# Patient Record
Sex: Female | Born: 1955 | Race: White | Hispanic: No | Marital: Married | Smoking: Former smoker
Health system: Southern US, Community
[De-identification: ages and names within clinical notes are randomized; demographics above are authoritative.]

## PROBLEM LIST (undated history)

## (undated) DIAGNOSIS — J449 Chronic obstructive pulmonary disease, unspecified: Secondary | ICD-10-CM

## (undated) DIAGNOSIS — J441 Chronic obstructive pulmonary disease with (acute) exacerbation: Secondary | ICD-10-CM

## (undated) DIAGNOSIS — I10 Essential (primary) hypertension: Secondary | ICD-10-CM

## (undated) DIAGNOSIS — J189 Pneumonia, unspecified organism: Secondary | ICD-10-CM

## (undated) DIAGNOSIS — E1110 Type 2 diabetes mellitus with ketoacidosis without coma: Secondary | ICD-10-CM

## (undated) HISTORY — PX: NO PAST SURGERIES: SHX2092

---

## 2017-03-18 ENCOUNTER — Inpatient Hospital Stay (HOSPITAL_COMMUNITY): Payer: Medicaid Other

## 2017-03-18 ENCOUNTER — Encounter (HOSPITAL_COMMUNITY): Payer: Self-pay | Admitting: Emergency Medicine

## 2017-03-18 ENCOUNTER — Inpatient Hospital Stay (HOSPITAL_COMMUNITY)
Admission: EM | Admit: 2017-03-18 | Discharge: 2017-04-10 | DRG: 871 | Disposition: E | Payer: Medicaid Other | Attending: Internal Medicine | Admitting: Internal Medicine

## 2017-03-18 ENCOUNTER — Emergency Department (HOSPITAL_COMMUNITY): Payer: Medicaid Other

## 2017-03-18 DIAGNOSIS — A419 Sepsis, unspecified organism: Principal | ICD-10-CM | POA: Diagnosis present

## 2017-03-18 DIAGNOSIS — Z888 Allergy status to other drugs, medicaments and biological substances status: Secondary | ICD-10-CM | POA: Diagnosis not present

## 2017-03-18 DIAGNOSIS — F17210 Nicotine dependence, cigarettes, uncomplicated: Secondary | ICD-10-CM | POA: Diagnosis present

## 2017-03-18 DIAGNOSIS — E1110 Type 2 diabetes mellitus with ketoacidosis without coma: Secondary | ICD-10-CM | POA: Diagnosis present

## 2017-03-18 DIAGNOSIS — N179 Acute kidney failure, unspecified: Secondary | ICD-10-CM | POA: Diagnosis present

## 2017-03-18 DIAGNOSIS — J690 Pneumonitis due to inhalation of food and vomit: Secondary | ICD-10-CM | POA: Diagnosis present

## 2017-03-18 DIAGNOSIS — R739 Hyperglycemia, unspecified: Secondary | ICD-10-CM | POA: Diagnosis present

## 2017-03-18 DIAGNOSIS — J9601 Acute respiratory failure with hypoxia: Secondary | ICD-10-CM | POA: Insufficient documentation

## 2017-03-18 DIAGNOSIS — H3530 Unspecified macular degeneration: Secondary | ICD-10-CM | POA: Diagnosis present

## 2017-03-18 DIAGNOSIS — J189 Pneumonia, unspecified organism: Secondary | ICD-10-CM | POA: Diagnosis not present

## 2017-03-18 DIAGNOSIS — G9341 Metabolic encephalopathy: Secondary | ICD-10-CM | POA: Diagnosis present

## 2017-03-18 DIAGNOSIS — J9621 Acute and chronic respiratory failure with hypoxia: Secondary | ICD-10-CM | POA: Diagnosis present

## 2017-03-18 DIAGNOSIS — S0181XA Laceration without foreign body of other part of head, initial encounter: Secondary | ICD-10-CM | POA: Diagnosis present

## 2017-03-18 DIAGNOSIS — Z66 Do not resuscitate: Secondary | ICD-10-CM | POA: Diagnosis present

## 2017-03-18 DIAGNOSIS — J9622 Acute and chronic respiratory failure with hypercapnia: Secondary | ICD-10-CM | POA: Diagnosis present

## 2017-03-18 DIAGNOSIS — Z515 Encounter for palliative care: Secondary | ICD-10-CM | POA: Diagnosis not present

## 2017-03-18 DIAGNOSIS — R634 Abnormal weight loss: Secondary | ICD-10-CM | POA: Diagnosis present

## 2017-03-18 DIAGNOSIS — E0810 Diabetes mellitus due to underlying condition with ketoacidosis without coma: Secondary | ICD-10-CM | POA: Diagnosis not present

## 2017-03-18 DIAGNOSIS — E871 Hypo-osmolality and hyponatremia: Secondary | ICD-10-CM | POA: Diagnosis present

## 2017-03-18 DIAGNOSIS — Z9981 Dependence on supplemental oxygen: Secondary | ICD-10-CM | POA: Diagnosis not present

## 2017-03-18 DIAGNOSIS — R4182 Altered mental status, unspecified: Secondary | ICD-10-CM

## 2017-03-18 DIAGNOSIS — J9602 Acute respiratory failure with hypercapnia: Secondary | ICD-10-CM

## 2017-03-18 DIAGNOSIS — J441 Chronic obstructive pulmonary disease with (acute) exacerbation: Secondary | ICD-10-CM | POA: Diagnosis present

## 2017-03-18 DIAGNOSIS — Z9181 History of falling: Secondary | ICD-10-CM

## 2017-03-18 DIAGNOSIS — W010XXA Fall on same level from slipping, tripping and stumbling without subsequent striking against object, initial encounter: Secondary | ICD-10-CM | POA: Diagnosis present

## 2017-03-18 DIAGNOSIS — Z7189 Other specified counseling: Secondary | ICD-10-CM | POA: Diagnosis not present

## 2017-03-18 DIAGNOSIS — E872 Acidosis: Secondary | ICD-10-CM | POA: Diagnosis present

## 2017-03-18 DIAGNOSIS — D72829 Elevated white blood cell count, unspecified: Secondary | ICD-10-CM | POA: Diagnosis present

## 2017-03-18 HISTORY — DX: Type 2 diabetes mellitus with ketoacidosis without coma: E11.10

## 2017-03-18 HISTORY — DX: Essential (primary) hypertension: I10

## 2017-03-18 HISTORY — DX: Chronic obstructive pulmonary disease, unspecified: J44.9

## 2017-03-18 HISTORY — DX: Pneumonia, unspecified organism: J18.9

## 2017-03-18 HISTORY — DX: Chronic obstructive pulmonary disease with (acute) exacerbation: J44.1

## 2017-03-18 LAB — COMPREHENSIVE METABOLIC PANEL
ALT: 12 U/L — ABNORMAL LOW (ref 14–54)
AST: 16 U/L (ref 15–41)
Albumin: 3.3 g/dL — ABNORMAL LOW (ref 3.5–5.0)
Alkaline Phosphatase: 157 U/L — ABNORMAL HIGH (ref 38–126)
BILIRUBIN TOTAL: 2.5 mg/dL — AB (ref 0.3–1.2)
BUN: 29 mg/dL — ABNORMAL HIGH (ref 6–20)
CHLORIDE: 92 mmol/L — AB (ref 101–111)
CO2: <7 mmol/L — ABNORMAL LOW (ref 22–32)
Calcium: 9.5 mg/dL (ref 8.9–10.3)
Creatinine, Ser: 1.93 mg/dL — ABNORMAL HIGH (ref 0.44–1.00)
GFR calc Af Amer: 31 mL/min — ABNORMAL LOW (ref >60)
GFR, EST NON AFRICAN AMERICAN: 27 mL/min — AB (ref >60)
Glucose, Bld: 588 mg/dL (ref 65–99)
POTASSIUM: 4 mmol/L (ref 3.5–5.1)
Sodium: 135 mmol/L (ref 135–145)
TOTAL PROTEIN: 6.9 g/dL (ref 6.5–8.1)

## 2017-03-18 LAB — CBG MONITORING, ED
GLUCOSE-CAPILLARY: 461 mg/dL — AB (ref 65–99)
GLUCOSE-CAPILLARY: 484 mg/dL — AB (ref 65–99)
GLUCOSE-CAPILLARY: 410 mg/dL — AB (ref 65–99)
GLUCOSE-CAPILLARY: 357 mg/dL — AB (ref 65–99)
GLUCOSE-CAPILLARY: 210 mg/dL — AB (ref 65–99)
GLUCOSE-CAPILLARY: 174 mg/dL — AB (ref 65–99)
Glucose-Capillary: 565 mg/dL (ref 65–99)
Glucose-Capillary: 305 mg/dL — ABNORMAL HIGH (ref 65–99)

## 2017-03-18 LAB — BASIC METABOLIC PANEL
ANION GAP: 19 — AB (ref 5–15)
ANION GAP: 13 (ref 5–15)
BUN: 23 mg/dL — ABNORMAL HIGH (ref 6–20)
BUN: 16 mg/dL (ref 6–20)
CALCIUM: 9.1 mg/dL (ref 8.9–10.3)
CHLORIDE: 106 mmol/L (ref 101–111)
CO2: 17 mmol/L — AB (ref 22–32)
CO2: 18 mmol/L — AB (ref 22–32)
Calcium: 9.3 mg/dL (ref 8.9–10.3)
Chloride: 110 mmol/L (ref 101–111)
Creatinine, Ser: 1.34 mg/dL — ABNORMAL HIGH (ref 0.44–1.00)
Creatinine, Ser: 1.07 mg/dL — ABNORMAL HIGH (ref 0.44–1.00)
GFR calc non Af Amer: 42 mL/min — ABNORMAL LOW (ref >60)
GFR calc non Af Amer: 55 mL/min — ABNORMAL LOW (ref >60)
GFR, EST AFRICAN AMERICAN: 49 mL/min — AB (ref >60)
GLUCOSE: 192 mg/dL — AB (ref 65–99)
Glucose, Bld: 181 mg/dL — ABNORMAL HIGH (ref 65–99)
POTASSIUM: 2.6 mmol/L — AB (ref 3.5–5.1)
POTASSIUM: 3.2 mmol/L — AB (ref 3.5–5.1)
Sodium: 142 mmol/L (ref 135–145)
Sodium: 141 mmol/L (ref 135–145)

## 2017-03-18 LAB — I-STAT ARTERIAL BLOOD GAS, ED
ACID-BASE DEFICIT: 23 mmol/L — AB (ref 0.0–2.0)
BICARBONATE: 5.4 mmol/L — AB (ref 20.0–28.0)
O2 Saturation: 88 %
PH ART: 7.058 — AB (ref 7.350–7.450)
PO2 ART: 73 mmHg — AB (ref 83.0–108.0)
Patient temperature: 97.6
TCO2: 6 mmol/L (ref 0–100)
pCO2 arterial: 18.9 mmHg — CL (ref 32.0–48.0)

## 2017-03-18 LAB — GLUCOSE, CAPILLARY
GLUCOSE-CAPILLARY: 154 mg/dL — AB (ref 65–99)
Glucose-Capillary: 147 mg/dL — ABNORMAL HIGH (ref 65–99)
Glucose-Capillary: 164 mg/dL — ABNORMAL HIGH (ref 65–99)
Glucose-Capillary: 165 mg/dL — ABNORMAL HIGH (ref 65–99)

## 2017-03-18 LAB — RAPID URINE DRUG SCREEN, HOSP PERFORMED
Amphetamines: NOT DETECTED
BARBITURATES: NOT DETECTED
BENZODIAZEPINES: NOT DETECTED
COCAINE: NOT DETECTED
Opiates: NOT DETECTED
TETRAHYDROCANNABINOL: NOT DETECTED

## 2017-03-18 LAB — AMMONIA: AMMONIA: 32 umol/L (ref 9–35)

## 2017-03-18 LAB — CBC WITH DIFFERENTIAL/PLATELET
BASOS ABS: 0 10*3/uL (ref 0.0–0.1)
BASOS PCT: 0 %
EOS ABS: 0 10*3/uL (ref 0.0–0.7)
Eosinophils Relative: 0 %
HCT: 45.3 % (ref 36.0–46.0)
Hemoglobin: 15.3 g/dL — ABNORMAL HIGH (ref 12.0–15.0)
LYMPHS ABS: 2.4 10*3/uL (ref 0.7–4.0)
LYMPHS PCT: 8 %
MCH: 28.5 pg (ref 26.0–34.0)
MCHC: 33.8 g/dL (ref 30.0–36.0)
MCV: 84.5 fL (ref 78.0–100.0)
Monocytes Absolute: 2.7 10*3/uL — ABNORMAL HIGH (ref 0.1–1.0)
Monocytes Relative: 9 %
NEUTROS ABS: 24.6 10*3/uL — AB (ref 1.7–7.7)
Neutrophils Relative %: 83 %
PLATELETS: 330 10*3/uL (ref 150–400)
RBC: 5.36 MIL/uL — ABNORMAL HIGH (ref 3.87–5.11)
RDW: 15.1 % (ref 11.5–15.5)
WBC: 29.7 10*3/uL — ABNORMAL HIGH (ref 4.0–10.5)

## 2017-03-18 LAB — URINALYSIS, ROUTINE W REFLEX MICROSCOPIC
Bilirubin Urine: NEGATIVE
Glucose, UA: >=500 mg/dL — AB
Ketones, ur: 80 mg/dL — AB
Leukocytes, UA: NEGATIVE
NITRITE: NEGATIVE
Protein, ur: 30 mg/dL — AB
SPECIFIC GRAVITY, URINE: 1.023 (ref 1.005–1.030)
pH: 5 (ref 5.0–8.0)

## 2017-03-18 LAB — I-STAT CG4 LACTIC ACID, ED
LACTIC ACID, VENOUS: 4.84 mmol/L — AB (ref 0.5–1.9)
LACTIC ACID, VENOUS: 1.72 mmol/L (ref 0.5–1.9)

## 2017-03-18 LAB — ETHANOL: Alcohol, Ethyl (B): <5 mg/dL (ref <5)

## 2017-03-18 LAB — CBC
HEMATOCRIT: 40.3 % (ref 36.0–46.0)
HEMOGLOBIN: 13.8 g/dL (ref 12.0–15.0)
MCH: 27.4 pg (ref 26.0–34.0)
MCHC: 34.2 g/dL (ref 30.0–36.0)
MCV: 80 fL (ref 78.0–100.0)
Platelets: 221 10*3/uL (ref 150–400)
RBC: 5.04 MIL/uL (ref 3.87–5.11)
RDW: 15.3 % (ref 11.5–15.5)
WBC: 15.2 10*3/uL — ABNORMAL HIGH (ref 4.0–10.5)

## 2017-03-18 LAB — I-STAT CHEM 8, ED
BUN: 31 mg/dL — AB (ref 6–20)
CALCIUM ION: 1.13 mmol/L — AB (ref 1.15–1.40)
CREATININE: 0.8 mg/dL (ref 0.44–1.00)
Chloride: 100 mmol/L — ABNORMAL LOW (ref 101–111)
GLUCOSE: 595 mg/dL — AB (ref 65–99)
HCT: 49 % — ABNORMAL HIGH (ref 36.0–46.0)
HEMOGLOBIN: 16.7 g/dL — AB (ref 12.0–15.0)
Potassium: 3.7 mmol/L (ref 3.5–5.1)
Sodium: 131 mmol/L — ABNORMAL LOW (ref 135–145)
TCO2: 9 mmol/L (ref 0–100)

## 2017-03-18 LAB — CK: Total CK: 25 U/L — ABNORMAL LOW (ref 38–234)

## 2017-03-18 LAB — I-STAT TROPONIN, ED: TROPONIN I, POC: 0.01 ng/mL (ref 0.00–0.08)

## 2017-03-18 LAB — BRAIN NATRIURETIC PEPTIDE: B Natriuretic Peptide: 126.2 pg/mL — ABNORMAL HIGH (ref 0.0–100.0)

## 2017-03-18 MED ORDER — ONDANSETRON HCL 4 MG/2ML IJ SOLN: 4.0000 mg | Freq: Four times a day (QID) | INTRAMUSCULAR | Status: DC | PRN

## 2017-03-18 MED ORDER — SODIUM CHLORIDE 0.9 % IV BOLUS (SEPSIS): 1000.0000 mL | Freq: Once | INTRAVENOUS | Status: DC

## 2017-03-18 MED ORDER — VANCOMYCIN HCL IN DEXTROSE 1-5 GM/200ML-% IV SOLN
1000.0000 mg | Freq: Once | INTRAVENOUS | Status: AC
  Administered 2017-03-18: 1000 mg via INTRAVENOUS
  Filled 2017-03-18: qty 200

## 2017-03-18 MED ORDER — LORAZEPAM 2 MG/ML IJ SOLN
1.0000 mg | Freq: Four times a day (QID) | INTRAMUSCULAR | Status: DC | PRN
  Administered 2017-03-18 – 2017-03-19 (×3): 1 mg via INTRAVENOUS
  Filled 2017-03-18 (×3): qty 1

## 2017-03-18 MED ORDER — SODIUM CHLORIDE 0.9 % IV SOLN
INTRAVENOUS | Status: DC
  Administered 2017-03-18: 5.1 [IU]/h via INTRAVENOUS
  Filled 2017-03-18: qty 2.5

## 2017-03-18 MED ORDER — SODIUM CHLORIDE 0.9 % IV BOLUS (SEPSIS)
500.0000 mL | Freq: Once | INTRAVENOUS | Status: AC
  Administered 2017-03-18: 500 mL via INTRAVENOUS

## 2017-03-18 MED ORDER — ENOXAPARIN SODIUM 40 MG/0.4ML ~~LOC~~ SOLN: 40.0000 mg | SUBCUTANEOUS | Status: DC

## 2017-03-18 MED ORDER — DEXTROSE-NACL 5-0.45 % IV SOLN: INTRAVENOUS | Status: DC

## 2017-03-18 MED ORDER — PIPERACILLIN-TAZOBACTAM 3.375 G IVPB
3.3750 g | Freq: Three times a day (TID) | INTRAVENOUS | Status: DC
  Administered 2017-03-18 – 2017-03-19 (×3): 3.375 g via INTRAVENOUS
  Filled 2017-03-18 (×4): qty 50

## 2017-03-18 MED ORDER — ONDANSETRON HCL 4 MG PO TABS: 4.0000 mg | ORAL_TABLET | Freq: Four times a day (QID) | ORAL | Status: DC | PRN

## 2017-03-18 MED ORDER — PIPERACILLIN-TAZOBACTAM 3.375 G IVPB 30 MIN
3.3750 g | Freq: Once | INTRAVENOUS | Status: AC
  Administered 2017-03-18: 3.375 g via INTRAVENOUS
  Filled 2017-03-18: qty 50

## 2017-03-18 MED ORDER — ACETAMINOPHEN 650 MG RE SUPP
650.0000 mg | Freq: Four times a day (QID) | RECTAL | Status: DC | PRN
Start: 2017-03-18 — End: 2017-03-22

## 2017-03-18 MED ORDER — SODIUM CHLORIDE 0.9 % IV SOLN
30.0000 meq | Freq: Once | INTRAVENOUS | Status: AC
  Administered 2017-03-18: 30 meq via INTRAVENOUS
  Filled 2017-03-18: qty 15

## 2017-03-18 MED ORDER — DEXTROSE-NACL 5-0.45 % IV SOLN
INTRAVENOUS | Status: DC
  Administered 2017-03-18: 20:00:00 via INTRAVENOUS

## 2017-03-18 MED ORDER — LORAZEPAM 2 MG/ML IJ SOLN
0.5000 mg | Freq: Once | INTRAMUSCULAR | Status: AC
  Administered 2017-03-18: 0.5 mg via INTRAVENOUS
  Filled 2017-03-18: qty 1

## 2017-03-18 MED ORDER — SODIUM CHLORIDE 0.9 % IV SOLN: INTRAVENOUS | Status: DC

## 2017-03-18 MED ORDER — DEXTROSE-NACL 5-0.9 % IV SOLN
Freq: Once | INTRAVENOUS | Status: AC
  Administered 2017-03-18: 17:00:00 via INTRAVENOUS

## 2017-03-18 MED ORDER — VANCOMYCIN HCL IN DEXTROSE 750-5 MG/150ML-% IV SOLN
750.0000 mg | Freq: Two times a day (BID) | INTRAVENOUS | Status: DC
  Filled 2017-03-18: qty 150

## 2017-03-18 MED ORDER — METHYLPREDNISOLONE SODIUM SUCC 125 MG IJ SOLR
125.0000 mg | Freq: Once | INTRAMUSCULAR | Status: AC
  Administered 2017-03-18: 125 mg via INTRAVENOUS
  Filled 2017-03-18: qty 2

## 2017-03-18 MED ORDER — ACETAMINOPHEN 325 MG PO TABS: 650.0000 mg | ORAL_TABLET | Freq: Four times a day (QID) | ORAL | Status: DC | PRN

## 2017-03-18 MED ORDER — MORPHINE SULFATE (PF) 4 MG/ML IV SOLN
2.0000 mg | INTRAVENOUS | Status: DC | PRN
  Administered 2017-03-18 – 2017-03-19 (×4): 2 mg via INTRAVENOUS
  Filled 2017-03-18 (×4): qty 1

## 2017-03-18 MED ORDER — SODIUM CHLORIDE 0.9 % IV BOLUS (SEPSIS)
2000.0000 mL | Freq: Once | INTRAVENOUS | Status: AC
  Administered 2017-03-18: 2000 mL via INTRAVENOUS

## 2017-03-18 MED ORDER — ALBUTEROL SULFATE (2.5 MG/3ML) 0.083% IN NEBU: 2.5000 mg | INHALATION_SOLUTION | RESPIRATORY_TRACT | Status: DC | PRN

## 2017-03-18 MED ORDER — SODIUM CHLORIDE 0.9 % IV SOLN
INTRAVENOUS | Status: DC
  Administered 2017-03-18: 5.2 [IU]/h via INTRAVENOUS
  Filled 2017-03-18: qty 2.5

## 2017-03-18 MED ORDER — METHYLPREDNISOLONE SODIUM SUCC 40 MG IJ SOLR
40.0000 mg | Freq: Three times a day (TID) | INTRAMUSCULAR | Status: DC
  Administered 2017-03-19: 40 mg via INTRAVENOUS
  Filled 2017-03-18: qty 1

## 2017-03-18 MED ORDER — IPRATROPIUM-ALBUTEROL 0.5-2.5 (3) MG/3ML IN SOLN
3.0000 mL | Freq: Four times a day (QID) | RESPIRATORY_TRACT | Status: DC
Start: 2017-03-18 — End: 2017-03-19
  Administered 2017-03-18 – 2017-03-19 (×4): 3 mL via RESPIRATORY_TRACT
  Filled 2017-03-18 (×4): qty 3

## 2017-03-18 MED ORDER — SODIUM CHLORIDE 0.9 % IV SOLN: INTRAVENOUS | Status: AC

## 2017-03-18 NOTE — ED Triage Notes (Signed)
Pt arrives via ems from home, ems reports pt fell onto her face last night around 7pm, began acting altered at 9.pt not following commands upon arrival. pts daughter at bedside states pt is normally a/o at baseline. 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

## 2017-03-18 NOTE — H&P (Signed)
History and Physical    Shirley Brady ZHY:[id number] DOB: 11-Oct-1956 DOA: 2017-04-09  PCP: Pcp Not In System . Unknown; Patient unresponsive Patient coming from: Home  I have personally briefly reviewed patient's old medical records in [facility]  Chief Complaint: Altered mental status  HPI: Shirley Brady is a 61 y.o. female with medical history significant of COPD on home oxygen 2L/min via nasal cannula , former smoker (ppd x45 years), who presented  with altered mental status. Patient is unresponsive currently, so she doesn't provide any history. Some history was obtained from daughter and grandson present at bedside Her grandson lives with her and assists with her care as she has macular degeneration.  At baseline (last normal ~ 1 month ago), she is normally independent of ADL's. She developed cold like symptoms around 2 weeks ago for which she was seen in urgent care center and put on Zithromax and oral prednisone. She became weaker and the Grandson called the urgent care back and was instructed to take her off antibiotics but leave her on prednisone. She has been off of prednisone for the last few days.  He reports she became progressively weaker to the point she had to be lifted off the toilet.  She fell on Friday (4/6 and hit her face) after tripping on a rug.  She did not seek care after the fall.  No LOC known.  She became progressively more weak and became more confused last evening. No known fevers, vomiting, chest pain, diarrhea. She had complained of nausea and abdominal pain. Of note, she has lost approximately 60 lbs in the last year.  ED Course: Patient was almost unresponsive with intermittent agitation. She was tachycardic, with leukocytosis and elevated lactic acid and hyperglycemia with CXR showing probable pneumonia. She was started on iv antibiotics and iv insulin. She was evaluated by PCCM; family wanted the patient to be DNR; so hospitalist service was called to admit.  Review  of Systems: couldn't be obtained due to unresponsiveness.  Past Medical History:  Diagnosis Date   COPD (chronic obstructive pulmonary disease) (HCC)    Past Surgical history: Unkonwn  Social History 45 pack year history of smoking; quit around 2 yrs ago. No current alcohol/illicit drug use as per family. Lives with family.   Allergies  Allergen Reactions   Lisinopril     Family history: couldn't be obtained as patient is unresponsive.  Prior to Admission medications   Couldn't be obtained due to unresponsiveness    Physical Exam: Vitals:   04-09-2017 1245 04/09/17 1300 04/09/2017 1315 2017/04/09 1330  BP: 94/69 96/84 100/80 99/70  Pulse:  (!) 128 (!) 117   Resp: (!) 22 (!) 26 (!) 23 (!) 22  Temp:      TempSrc:      SpO2:  95% 96%   Weight:        Constitutional: looks older than stated age; very thinly built; appears very ill Vitals:   2017/04/09 1245 09-Apr-2017 1300 04-09-2017 1315 04/09/17 1330  BP: 94/69 96/84 100/80 99/70  Pulse:  (!) 128 (!) 117   Resp: (!) 22 (!) 26 (!) 23 (!) 22  Temp:      TempSrc:      SpO2:  95% 96%   Weight:       Eyes: PERRL, lids and conjunctivae normal ENMT: Mucous membranes are dry. Neck: normal, supple, no masses, no thyromegaly, no lymphadenopathy Respiratory: Bilateral decreased breath sounds at bases with prolonged expiration; tachypneic; bilateral coarse crackles.Marland Kitchen  Cardiovascular:  S1 S2 hear; tachycardic; no murmurs / rubs / gallops. No extremity edema. 2+ pedal pulses.  Abdomen: no tenderness, no masses palpated. No hepatosplenomegaly. Bowel sounds positive.  Musculoskeletal: no clubbing / cyanosis.  Skin: no rashes, lesions, ulcers. No induration Neurologic: very lethargic; very minimal movement with painful stimulus; no obvious focal neurologic deficit Psychiatric: unable to examine due to unresponsivenss   Labs on Admission: I have personally reviewed following labs and imaging studies  CBC:  Recent Labs Lab  27-Mar-2017 0751 03-27-2017 0804  WBC 29.7*  --   NEUTROABS 24.6*  --   HGB 15.3* 16.7*  HCT 45.3 49.0*  MCV 84.5  --   PLT 330  --    Basic Metabolic Panel:  Recent Labs Lab 03/27/2017 0751 March 27, 2017 0804  NA 135 131*  K 4.0 3.7  CL 92* 100*  CO2 <7*  --   GLUCOSE 588* 595*  BUN 29* 31*  CREATININE 1.93* 0.80  CALCIUM 9.5  --    GFR: CrCl cannot be calculated (Unknown ideal weight.). Liver Function Tests:  Recent Labs Lab 03-27-2017 0751  AST 16  ALT 12*  ALKPHOS 157*  BILITOT 2.5*  PROT 6.9  ALBUMIN 3.3*   No results for input(s): LIPASE, AMYLASE in the last 168 hours.  Recent Labs Lab 03-27-2017 0751  AMMONIA 32   Coagulation Profile: No results for input(s): INR, PROTIME in the last 168 hours. Cardiac Enzymes:  Recent Labs Lab 2017/03/27 0756  CKTOTAL 25*   BNP (last 3 results) No results for input(s): PROBNP in the last 8760 hours. HbA1C: No results for input(s): HGBA1C in the last 72 hours. CBG:  Recent Labs Lab Mar 27, 2017 0901 03/27/17 1012 03/27/2017 1138 03/27/17 1249  GLUCAP 565* 461* 484* 410*   Lipid Profile: No results for input(s): CHOL, HDL, LDLCALC, TRIG, CHOLHDL, LDLDIRECT in the last 72 hours. Thyroid Function Tests: No results for input(s): TSH, T4TOTAL, FREET4, T3FREE, THYROIDAB in the last 72 hours. Anemia Panel: No results for input(s): VITAMINB12, FOLATE, FERRITIN, TIBC, IRON, RETICCTPCT in the last 72 hours. Urine analysis:    Component Value Date/Time   COLORURINE YELLOW 2017/03/27 0928   APPEARANCEUR HAZY (A) March 27, 2017 0928   LABSPEC 1.023 03-27-2017 0928   PHURINE 5.0 March 27, 2017 0928   GLUCOSEU >=500 (A) 03-27-2017 0928   HGBUR SMALL (A) Mar 27, 2017 0928   BILIRUBINUR NEGATIVE 2017/03/27 0928   KETONESUR 80 (A) March 27, 2017 0928   PROTEINUR 30 (A) March 27, 2017 0928   NITRITE NEGATIVE 27-Mar-2017 0928   LEUKOCYTESUR NEGATIVE 2017/03/27 0928    Radiological Exams on Admission: Dg Chest Port 1 View  Result Date:  2017-03-27 CLINICAL DATA:  Altered mental status. EXAM: PORTABLE CHEST 1 VIEW COMPARISON:  None. FINDINGS: Bibasilar opacities are seen, right greater than left. The cardiomediastinal silhouette is unremarkable. No pulmonary nodules or masses. IMPRESSION: Right greater than left bibasilar pulmonary opacities. Pneumonia not excluded on this study. Recommend clinical correlation and follow-up to resolution. Electronically Signed   By: Gerome Sam III M.D   On: 27-Mar-2017 08:48      Assessment/Plan 1. Sepsis: probably d/t pneumonia; lactic acid improved with iv fluid bolus. Follow up cultures; continue NS /hr. Continue Zosyn. Discontinue Vancomycin 2. Bilateral community acquired pneumonia with concern for aspiration: continue Zosyn for now. Follow up cultures. Speech evaluation once patient is more awake. Continue oxygen via nasal cannula. Family don't want BIPAP or intubation. Aspiration precautions. Consider hospice vs comfort measures if respiratory status/overall condition worsens; family in agreement.  3. Probable  Diabetic ketoacidosis in a patient with no past history of diabetes: DKA protocol with insulin drip; iv fluids; trend BMP q4 hourly for now; replace potassium. Follow HbA1c. 4. Acute on chronic hypoxic respiratory failure: due to pneumonia/COPD exacerbation 5. COPD exacerbation: iv steroids; duonebs, supplemental oxygen. PCCM consult appreciated. 6. Acute metabolic encephalopathy: probably d/t hypoxia/pneumonia/copd exacerbation. CT head to rule out intracranial bleed 7. Recent fall: fall precautions. 8. Leukocytosis: d/t sepsis/pneumonia; follow labs in AM  9. Hyperglycemia: d/t uncontrolled diabetes vs reactive from pneumonia/recent steroid use. 9. Recent weight loss: consider CT chest to look for lung mass if patient improves and family wants aggressive measures.    DVT prophylaxis:  Lovenox Code Status: DNR Family Communication: Discussed plan of care with daughter and  grandson present at bedsid. Disposition Plan: probably SNF placement if patient improves Consults called: PCCM (J. Alexis Frock, MD) Admission status: Stepdown unit  Glade Lloyd MD [facility] Pager [phone] If 7PM-7AM, please contact night-coverage [email or web address] Password [email or web address]  04/06/2017, 1:41 PM

## 2017-03-18 NOTE — ED Notes (Signed)
Critical care at bedside  

## 2017-03-18 NOTE — Progress Notes (Signed)
ABG ordered for patient.  Results given to MD.  No further instructions at this time.  Will continue to monitor.    Ref. Range 03/14/2017 07:55  Sample type Unknown ARTERIAL  pH, Arterial Latest Ref Range: 7.350 - 7.450  7.058 (LL)  pCO2 arterial Latest Ref Range: 32.0 - 48.0 mmHg 18.9 (LL)  pO2, Arterial Latest Ref Range: 83.0 - 108.0 mmHg 73.0 (L)  TCO2 Latest Ref Range: 0 - 100 mmol/L 6  Acid-base deficit Latest Ref Range: 0.0 - 2.0 mmol/L 23.0 (H)  Bicarbonate Latest Ref Range: 20.0 - 28.0 mmol/L 5.4 (L)  O2 Saturation Latest Units: % 88.0  Patient temperature Unknown 97.6 F  Collection site Unknown RADIAL, ALLEN'S T.Marland KitchenMarland Kitchen

## 2017-03-18 NOTE — ED Notes (Signed)
CT called and made aware pt is ready to come for scan.

## 2017-03-18 NOTE — Consult Note (Signed)
PULMONARY / CRITICAL CARE MEDICINE   Name: Shirley Brady MRN: [id number] DOB: 12/30/1955    ADMISSION DATE:  03/11/2017 CONSULTATION DATE:  03/16/2017  REFERRING MD:  Dr. Rosalia Hammers  CHIEF COMPLAINT:  AMS, weakness  HISTORY OF PRESENT ILLNESS: 61 y/o F, former smoker (ppd x45 years), who presented to [facility] on 4/8 with altered mental status, weakness, hyperglycemia and falls.   Daughter and grandson at bedside reports she lives at home in [place].  Her grandson lives with her and assists with her care as she has macular degeneration.  At baseline (last normal ~ 1 month ago), she is normally independent of ADL's.  She used to work in a Museum/gallery curator.  Approximately 2 weeks ago she developed "cold like symptoms" and was unable to be seen by her PCP.  They sought care at an UC and was treated with antibiotics and prednisone. After 24 hours on medication, she became weaker and the Grandson called the UC back and was instructed to take her off antibiotics but leave her on prednisone. He reports she became progressively weaker to the point she had to be lifted off the toilet.  She fell on Friday (4/6 and hit her face) after tripping on a rug.  She did not seek care after the fall.  No LOC known.  She became progressively more weak and developed confusion 4/7 pm.  She woke her grandson every 20-30 minutes because she couldn't sleep.  She typically sleeps in a recliner.  She complained of heart burn and was so confused she couldn't remember her nexium (which she normally knows).  She has not had known fevers, diarrhea or chest pain.  Grandson reported nausea and dry heaves but no vomiting.  She also had abdominal pain.  Of note, she has lost approximately 60 lbs in the last year.  She was sent to the ER at [facility] for evaluation.  Initial labs - Na 131, K 3.7, Cl 100, glucose 595, BUN 31, sr cr 0.80, lactic acid 4.84 > cleared to 1.72, troponin 0.01, WBC 29.7, Hgb 15.3 and platelets 330.  Initial CXR concerning for R>L  infiltrates worrisome for PNA.  Family deny known history of DM.  PCCM consulted for evaluation.   PAST MEDICAL HISTORY :  She  has a past medical history of COPD (chronic obstructive pulmonary disease) (HCC).  PAST SURGICAL HISTORY: She  has no past surgical history on file.  Allergies  Allergen Reactions  . Lisinopril     No current facility-administered medications on file prior to encounter.    No current outpatient prescriptions on file prior to encounter.    FAMILY HISTORY:  Her has no family status information on file.    SOCIAL HISTORY: She  reports that she has been smoking Cigarettes.  She has smoked for the past 45.00 years. She does not have any smokeless tobacco history on file.  REVIEW OF SYSTEMS:  Unable to complete as patient is altered   SUBJECTIVE:   VITAL SIGNS: BP 110/74   Pulse (!) 122   Temp 97.6 F (36.4 C) (Rectal)   Resp (!) 24   Wt 143 lb (64.9 kg)   SpO2 95%   HEMODYNAMICS:    VENTILATOR SETTINGS:    INTAKE / OUTPUT: No intake/output data recorded.  PHYSICAL EXAMINATION: General: chronically ill appearing adult female, appears much older than stated age HEENT: MM pink/moist, no jvd PSY: intermittent agitation Neuro: lethargic, moves all ext's CV: s1s2 rrr, no m/r/g PULM: even/non-labored, lungs bilaterally with  coarse rhonchi  ZO:XWRU, non-tender, bsx4 active  Extremities: warm/dry, no edema  Skin: no rashes or lesions   LABS:  BMET  Recent Labs Lab 03/19/2017 0751 03/12/2017 0804  NA 135 131*  K 4.0 3.7  CL 92* 100*  CO2 <7*  --   BUN 29* 31*  CREATININE 1.93* 0.80  GLUCOSE 588* 595*    Electrolytes  Recent Labs Lab 03/25/2017 0751  CALCIUM 9.5    CBC  Recent Labs Lab 03/28/2017 0751 04/08/2017 0804  WBC 29.7*  --   HGB 15.3* 16.7*  HCT 45.3 49.0*  PLT 330  --     Coag's No results for input(s): APTT, INR in the last 168 hours.  Sepsis Markers  Recent Labs Lab 03/16/2017 0804 03/14/2017 1143   LATICACIDVEN 4.84* 1.72    ABG  Recent Labs Lab 03/12/2017 0755  PHART 7.058*  PCO2ART 18.9*  PO2ART 73.0*    Liver Enzymes  Recent Labs Lab 03/17/2017 0751  AST 16  ALT 12*  ALKPHOS 157*  BILITOT 2.5*  ALBUMIN 3.3*    Cardiac Enzymes No results for input(s): TROPONINI, PROBNP in the last 168 hours.  Glucose  Recent Labs Lab 03/17/2017 0901 04/05/2017 1012 04/04/2017 1138  GLUCAP 565* 461* 484*    Imaging Dg Chest Port 1 View  Result Date: 04/02/2017 CLINICAL DATA:  Altered mental status. EXAM: PORTABLE CHEST 1 VIEW COMPARISON:  None. FINDINGS: Bibasilar opacities are seen, right greater than left. The cardiomediastinal silhouette is unremarkable. No pulmonary nodules or masses. IMPRESSION: Right greater than left bibasilar pulmonary opacities. Pneumonia not excluded on this study. Recommend clinical correlation and follow-up to resolution. Electronically Signed   By: Gerome Sam III M.D   On: 03/26/2017 08:48     STUDIES:  CT Head 4/8 >>   CULTURES: BCx2 4/8 >>  Sputum 4/8 >>   ANTIBIOTICS: Vanco 4/8 >>  Zosyn 4/8 >>   SIGNIFICANT EVENTS: 4/08  Admit with possible PNA, hyperglycemia    DISCUSSION: 61 y/o F, former smoker, with presumed COPD who presented with AMS, recent falls, hyperglycemia and concern for PNA.   ASSESSMENT / PLAN:  Acute on Chronic Hypoxic Respiratory Failure - in setting of suspected PNA  Presumed End Stage COPD Plan: Wean O2 for sats 88-95% Hold home symbicort, spiriva Duoneb Q6 with PRN albuterol   CAP - would not consider this antibiotic failure as she did not complete course, possible component of aspiration given overall deconditioning / weakness  Plan: Narrow abx to CAP coverage > defer to primary MD Trend CXR  Pulmonary hygiene as able  Aspiration precautions  NTS PRN   Mechanical Falls - most recent with facial laceration  Plan: Assess CT head   Hyperglycemia - no hx of DM, suspect infection + recent steroids  ??  Plan: Assess Hgb A1c Insulin gtt  Hyponatremia  Plan: Gentle IVF's    AGMA / Lactic Acidosis - suspect in setting of sepsis from PNA + increased work of breathing  Plan: Lactate cleared  ABX as above    FAMILY  - Updates: Daughter and Grandson updated at bedside 4/8.  Family indicated she would not want ventilator or CPR under any circumstances.  Daughter is former paramedic.  No vasopressors or defib.  DNR / DNI placed. They are accepting of aggressive medical care (IVF, abx etc) otherwise.  If declines, they would want notification and would consider possible comfort measures after discussion.   PCCM will be available PRN.  Please call if new  needs arise.   Canary Brim, NP-C [facility] Pgr: [phone] or if no answer [phone] 03/19/2017, 12:05 PM  ATTENDING NOTE / ATTESTATION NOTE :   I have discussed the case with the resident/APP  Canary Brim NP  I agree with the resident/APP's  history, physical examination, assessment, and plans.    I have edited the above note and modified it according to our agreed history, physical examination, assessment and plan.   Briefly, 61 y/o F, former smoker (ppd x45 years), who presented to [facility] on 4/8 with altered mental status, weakness, hyperglycemia and falls.   Daughter and grandson at bedside reports she lives at home in [place].  Her grandson lives with her and assists with her care as she has macular degeneration.  At baseline (last normal ~ 1 month ago), she is normally independent of ADL's.  She used to work in a Museum/gallery curator.  Approximately 2 weeks ago she developed "cold like symptoms" and was unable to be seen by her PCP.  They sought care at an UC and was treated with antibiotics and prednisone. After 24 hours on medication, she became weaker and the Grandson called the UC back and was instructed to take her off antibiotics but leave her on prednisone. He reports she became progressively weaker to the  point she had to be lifted off the toilet.  She fell on Friday (4/6 and hit her face) after tripping on a rug.  She did not seek care after the fall.  No LOC known.  She became progressively more weak and developed confusion 4/7 pm.  She woke her grandson every 20-30 minutes because she couldn't sleep.  She typically sleeps in a recliner.  She complained of heart burn and was so confused she couldn't remember her nexium (which she normally knows).  She has not had known fevers, diarrhea or chest pain.  Grandson reported nausea and dry heaves but no vomiting.  She also had abdominal pain.  Of note, she has lost approximately 60 lbs in the last year.  She was sent to the ER at [facility] for evaluation.  Initial labs - Na 131, K 3.7, Cl 100, glucose 595, BUN 31, sr cr 0.80, lactic acid 4.84 > cleared to 1.72, troponin 0.01, WBC 29.7, Hgb 15.3 and platelets 330.  Initial CXR concerning for R>L infiltrates worrisome for PNA.  Family deny known history of DM.  PCCM consulted for evaluation.    Vitals:  Vitals:   04/02/2017 1145 March 22, 2017 1200 04/09/2017 1230 03/29/2017 1245  BP: 107/80 109/74 106/76 94/69  Pulse: (!) 122  (!) 120   Resp: (!) 22 (!) 23 (!) 22 (!) 22  Temp:      TempSrc:      SpO2: 95%  94%   Weight:        Constitutional/General: Chronically ill, cachectic, in resp distress. Drowsy, answers simple questions.   There is no height or weight on file to calculate BMI. Wt Readings from Last 3 Encounters:  22-Mar-2017 64.9 kg (143 lb)    HEENT: PERLA, anicteric sclerae. (-) Oral thrush.  Neck: No masses. Midline trachea. No JVD, (-) LAD. (-) bruits appreciated.  Respiratory/Chest: Grossly normal chest. (-) deformity. (+)  Accessory muscle use.  Symmetric expansion. Diminished BS on both lower lung zones. (-)  crackles, rhonchi. Wheezing in BLF.  (-) egophony  Cardiovascular: Regular rate and  rhythm, heart sounds normal, no murmur or gallops,  Trace peripheral edema  Gastrointestinal:   Normal  bowel sounds. Soft, non-tender. No hepatosplenomegaly.  (-) masses.   Musculoskeletal:  Normal muscle tone.   Extremities: Grossly normal. (-) clubbing, cyanosis.  (-) edema  Skin: (-) rash,lesions seen.   Neurological/Psychiatric :CN grossly intact. (-) lateralizing signs.     CBC Recent Labs     2017/04/06  0751  April 06, 2017  0804  WBC  29.7*   --   HGB  15.3*  16.7*  HCT  45.3  49.0*  PLT  330   --     Coag's No results for input(s): APTT, INR in the last 72 hours.  BMET Recent Labs     04/06/17  0751  04/06/17  0804  NA  135  131*  K  4.0  3.7  CL  92*  100*  CO2  <7*   --   BUN  29*  31*  CREATININE  1.93*  0.80  GLUCOSE  588*  595*    Electrolytes Recent Labs     2017-04-06  0751  CALCIUM  9.5    Sepsis Markers No results for input(s): PROCALCITON, O2SATVEN in the last 72 hours.  Invalid input(s): LACTICACIDVEN  ABG Recent Labs     04-06-2017  0755  PHART  7.058*  PCO2ART  18.9*  PO2ART  73.0*    Liver Enzymes Recent Labs     April 06, 2017  0751  AST  16  ALT  12*  ALKPHOS  157*  BILITOT  2.5*  ALBUMIN  3.3*    Cardiac Enzymes No results for input(s): TROPONINI, PROBNP in the last 72 hours.  Glucose Recent Labs     04-06-17  0901  04/06/17  1012  2017/04/06  1138  04-06-17  1249  GLUCAP  565*  461*  484*  410*    Imaging Dg Chest Port 1 View  Result Date: 04-06-17 CLINICAL DATA:  Altered mental status. EXAM: PORTABLE CHEST 1 VIEW COMPARISON:  None. FINDINGS: Bibasilar opacities are seen, right greater than left. The cardiomediastinal silhouette is unremarkable. No pulmonary nodules or masses. IMPRESSION: Right greater than left bibasilar pulmonary opacities. Pneumonia not excluded on this study. Recommend clinical correlation and follow-up to resolution. Electronically Signed   By: Gerome Sam III M.D   On: 04/06/2017 08:48   Assessment/Plan : Acute on chronic hypoxemic hypercapnic respiratory failure secondary to acute  exacerbation of COPD + Bibasilar PNA, suspect aspiration PNA - Patient appears to have end-stage COPD. - We discussed the overall condition and prognosis. Daughter is a Radiation protection practitioner. She understands sweats going on. Patient is a full DO NOT RESUSCITATE. - Continue with current oxygen therapy, titrate to keep oxygen level more than 88%. - May try BiPAP if patient wants to. I anticipate, she will not want BiPAP. - Treat with broad-spectrum antibiotics. De-escalate accordingly. - Pulmicort twice a day, Brovana twice a day, Atrovent 4 times a day. Via nebulizer - IV steroids medrol 40 mg q8 >> taper down quickly 2/2 hyperglycemia  Metabolic acidosis, could be related to hyperglycemic state, possible sepsis, hypovolemia - Can try bicarbonate infusion at 50 mls/  hour. - Currently being treated as DKA. She is on insulin drip. - Cont  IV fluids - treat possible infection. .   Family :Family updated at length today.  I discussed the plan with patient's daughter and grandson.  PCCM will sign off for now.  Call back if with issues.    Pollie Meyer, MD 2017/04/06, 1:17 PM [facility] Pager [phone]  1310 After 3 pm or if no answer, call [phone]

## 2017-03-18 NOTE — ED Notes (Signed)
Radiology at bedside

## 2017-03-18 NOTE — ED Notes (Signed)
Respiratory called to suction patient. 

## 2017-03-18 NOTE — ED Notes (Signed)
RRT Brandy at bedside to suction patient.

## 2017-03-18 NOTE — Progress Notes (Signed)
Pharmacy Antibiotic Note  Shirley Brady is a 61 y.o. female admitted on 04/04/2017 with sepsis.  Pharmacy has been consulted for Vancomycin and Zosyn dosing, first doses have already been ordered.  Pt with AMS after falling onto her face last night.  She is on 2L home O2 for end stage COPD.  Plan: Vancomycin  IV q12 Zosyn 3.375g IV q8, infuse over 4hr F/U renal fxn, clinical status, cx Vanc trough at steady state     Temp (24hrs), Avg:97.6 F (36.4 C), Min:97.6 F (36.4 C), Max:97.6 F (36.4 C)   Recent Labs Lab 03/12/2017 0804  CREATININE 0.80  LATICACIDVEN 4.84*    CrCl cannot be calculated (Unknown ideal weight.).    Allergies not on file  Antimicrobials this admission: Vanc 4/8 >>  Zosyn 4/8 >>   Dose adjustments this admission: n/a  Microbiology results: 4/8 BCx:    Thank you for allowing pharmacy to be a part of this patient's care.  Marisue Humble, PharmD Clinical Pharmacist [facility]

## 2017-03-18 NOTE — ED Provider Notes (Signed)
[facility]-EMERGENCY DEPT Provider Note   CSN: [id number] Arrival date & time: 03/15/2017  0735     History   Chief Complaint Chief Complaint  Patient presents with  . Altered Mental Status  . Fall   Level V caveat due to altered mental status  HPI Shirley Brady is a 61 y.o. female who arrives via EMS with her daughter combative with altered mental status. History is given by the daughter who states she has a history of COPD. She lives with her son. According to the daughter, she has had some increasing confusion over the past week. She states that 3 days ago she fell and hit the front of her face and since that time has had worsening confusion. Her daughter states that yesterday she was able to eat and flank. However, today the patient became extremely confused, combative and was brought in for evaluation. Her daughter denies a history of any recent infections or illness. She's had no new medication changes. She denies a history of alcohol abuse. The patient is seen at a clinic in [place]. Patient is normally on 2 L of oxygen, however, has not been tolerating night nasal cannula because of her confusion and combativeness. Her daughter states that she quit smoking about a year ago.   HPI  Past Medical History:  Diagnosis Date  . COPD (chronic obstructive pulmonary disease) (HCC)     There are no active problems to display for this patient.   No past surgical history on file.  OB History    No data available       Home Medications    Prior to Admission medications   Not on File    Family History No family history on file.  Social History Social History  Substance Use Topics  . Smoking status: Not on file  . Smokeless tobacco: Not on file  . Alcohol use Not on file     Allergies   Lisinopril   Review of Systems Review of Systems  Unable to perform ROS: Mental status change     Physical Exam Updated Vital Signs BP 100/74   Pulse (!) 136   Temp 97.6 F (36.4  C) (Rectal)   Resp (!) 26   Wt 64.9 kg   SpO2 94%   Physical Exam  Constitutional: She appears distressed.  Appears chronically ill Patient is confused, speaking in nonsensical terms She will not sit still in the examination bed and is breathing quickly and heavily.  HENT:  Head: Normocephalic and atraumatic.  Eyes: Pupils are equal, round, and reactive to light.  Neck: Normal range of motion. No tracheal deviation present.  Cardiovascular:  Tachycardic  Pulmonary/Chest:  Mild diffuse rhonchi, good air movement, Tachypnea  Abdominal: Soft. Bowel sounds are normal. She exhibits no distension. There is no tenderness. There is no guarding.  Musculoskeletal: Normal range of motion.  Neurological: She is alert. GCS eye subscore is 4. GCS verbal subscore is 4. GCS motor subscore is 5.  Nursing note and vitals reviewed.    ED Treatments / Results  Labs (all labs ordered are listed, but only abnormal results are displayed) Labs Reviewed  CBC WITH DIFFERENTIAL/PLATELET - Abnormal; Notable for the following:       Result Value   WBC 29.7 (*)    RBC 5.36 (*)    Hemoglobin 15.3 (*)    All other components within normal limits  I-STAT CHEM 8, ED - Abnormal; Notable for the following:    Sodium 131 (*)  Chloride 100 (*)    BUN 31 (*)    Glucose, Bld 595 (*)    Calcium, Ion 1.13 (*)    Hemoglobin 16.7 (*)    HCT 49.0 (*)    All other components within normal limits  I-STAT ARTERIAL BLOOD GAS, ED - Abnormal; Notable for the following:    pH, Arterial 7.058 (*)    pCO2 arterial 18.9 (*)    pO2, Arterial 73.0 (*)    Bicarbonate 5.4 (*)    Acid-base deficit 23.0 (*)    All other components within normal limits  I-STAT CG4 LACTIC ACID, ED - Abnormal; Notable for the following:    Lactic Acid, Venous 4.84 (*)    All other components within normal limits  CULTURE, BLOOD (ROUTINE X 2)  CULTURE, BLOOD (ROUTINE X 2)  AMMONIA  ETHANOL  COMPREHENSIVE METABOLIC PANEL  RAPID URINE  DRUG SCREEN, HOSP PERFORMED  URINALYSIS, ROUTINE W REFLEX MICROSCOPIC  BRAIN NATRIURETIC PEPTIDE  CK  I-STAT TROPOININ, ED    EKG  EKG Interpretation None       Radiology Dg Chest Port 1 View  Result Date: 2017-03-21 CLINICAL DATA:  Altered mental status. EXAM: PORTABLE CHEST 1 VIEW COMPARISON:  None. FINDINGS: Bibasilar opacities are seen, right greater than left. The cardiomediastinal silhouette is unremarkable. No pulmonary nodules or masses. IMPRESSION: Right greater than left bibasilar pulmonary opacities. Pneumonia not excluded on this study. Recommend clinical correlation and follow-up to resolution. Electronically Signed   By: Gerome Sam III M.D   On: 2017-03-21 08:48    Procedures .Critical Care Performed by: Arthor Captain Authorized by: Arthor Captain   Critical care provider statement:    Critical care time (minutes):  70   Critical care was necessary to treat or prevent imminent or life-threatening deterioration of the following conditions:  Metabolic crisis   Critical care was time spent personally by me on the following activities:  Discussions with consultants, evaluation of patient's response to treatment, examination of patient, interpretation of cardiac output measurements, obtaining history from patient or surrogate, ordering and performing treatments and interventions, ordering and review of laboratory studies, ordering and review of radiographic studies, pulse oximetry, re-evaluation of patient's condition and review of old charts   (including critical care time)  Medications Ordered in ED Medications  piperacillin-tazobactam (ZOSYN) IVPB 3.375 g (3.375 g Intravenous New Bag/Given 21-Mar-2017 0828)  vancomycin (VANCOCIN) IVPB 1000 mg/200 mL premix (1,000 mg Intravenous New Bag/Given 03-21-2017 0828)  insulin regular (NOVOLIN R,HUMULIN R) 250 Units in sodium chloride 0.9 % 250 mL (1 Units/mL) infusion (not administered)  vancomycin (VANCOCIN) IVPB 750 mg/150 ml  premix (not administered)  piperacillin-tazobactam (ZOSYN) IVPB 3.375 g (not administered)  LORazepam (ATIVAN) injection 0.5 mg (0.5 mg Intravenous Given 03-21-17 0823)  sodium chloride 0.9 % bolus 2,000 mL (2,000 mLs Intravenous New Bag/Given 03/21/17 0820)     Initial Impression / Assessment and Plan / ED Course  I have reviewed the triage vital signs and the nursing notes.  Pertinent labs & imaging results that were available during my care of the patient were reviewed by me and considered in my medical decision making (see chart for details).  Clinical Course as of Mar 19 1527  Sun 2017/03/21  0859 pH, Arterial: (!!) 7.058 [AH]  0901 pCO2 arterial: (!!) 18.9 [AH]  0901 Patient with metabolic acidosis.  Bicarbonate: (!) 5.4 [AH]  0901 High anion gap (calculated at 34)  [AH]  0909 High leukocytosis WBC: (!) 29.7 [AH]  0454 Patient appears to have Pneumonia  DG CHEST PORT 1 VIEW [AH]  0912 Patient with AKI Creatinine: (!) 1.93 [AH]  0981 Patient's daughter states that she would not want intubation, however she is NOT DNR. Therefore I feel that the patient would do best in ICU given her end stage COPD and critical illness.  I have placed a call to the intensivist.  [AH]  1200 Patient evaluated by critical care and feels that she is appropriate for stepdown.  [AH]    Clinical Course User Index [AH] Arthor Captain, PA-C    Patient made DNR/DNI. She will be admitted by the hospitalist. Labs are improving throughout visit  Final Clinical Impressions(s) / ED Diagnoses   Final diagnoses:  Altered mental status    New Prescriptions New Prescriptions   No medications on file     Arthor Captain, PA-C 03/23/2017 1530    Margarita Grizzle, MD 03/24/2017 1701

## 2017-03-19 DIAGNOSIS — J189 Pneumonia, unspecified organism: Secondary | ICD-10-CM

## 2017-03-19 DIAGNOSIS — A419 Sepsis, unspecified organism: Principal | ICD-10-CM

## 2017-03-19 DIAGNOSIS — E0810 Diabetes mellitus due to underlying condition with ketoacidosis without coma: Secondary | ICD-10-CM

## 2017-03-19 DIAGNOSIS — G9341 Metabolic encephalopathy: Secondary | ICD-10-CM

## 2017-03-19 DIAGNOSIS — J9621 Acute and chronic respiratory failure with hypoxia: Secondary | ICD-10-CM

## 2017-03-19 LAB — GLUCOSE, CAPILLARY
GLUCOSE-CAPILLARY: 160 mg/dL — AB (ref 65–99)
GLUCOSE-CAPILLARY: 164 mg/dL — AB (ref 65–99)
GLUCOSE-CAPILLARY: 165 mg/dL — AB (ref 65–99)
GLUCOSE-CAPILLARY: 166 mg/dL — AB (ref 65–99)
GLUCOSE-CAPILLARY: 167 mg/dL — AB (ref 65–99)
GLUCOSE-CAPILLARY: 170 mg/dL — AB (ref 65–99)
GLUCOSE-CAPILLARY: 173 mg/dL — AB (ref 65–99)
GLUCOSE-CAPILLARY: 184 mg/dL — AB (ref 65–99)
GLUCOSE-CAPILLARY: 184 mg/dL — AB (ref 65–99)
Glucose-Capillary: 174 mg/dL — ABNORMAL HIGH (ref 65–99)
Glucose-Capillary: 175 mg/dL — ABNORMAL HIGH (ref 65–99)
Glucose-Capillary: 165 mg/dL — ABNORMAL HIGH (ref 65–99)
Glucose-Capillary: 166 mg/dL — ABNORMAL HIGH (ref 65–99)
Glucose-Capillary: 168 mg/dL — ABNORMAL HIGH (ref 65–99)
Glucose-Capillary: 193 mg/dL — ABNORMAL HIGH (ref 65–99)

## 2017-03-19 LAB — BASIC METABOLIC PANEL
ANION GAP: 13 (ref 5–15)
ANION GAP: 11 (ref 5–15)
BUN: 21 mg/dL — ABNORMAL HIGH (ref 6–20)
BUN: 19 mg/dL (ref 6–20)
CHLORIDE: 110 mmol/L (ref 101–111)
CHLORIDE: 116 mmol/L — AB (ref 101–111)
CO2: 21 mmol/L — AB (ref 22–32)
CO2: 16 mmol/L — ABNORMAL LOW (ref 22–32)
CREATININE: 0.7 mg/dL (ref 0.44–1.00)
Calcium: 9.3 mg/dL (ref 8.9–10.3)
Calcium: 9.6 mg/dL (ref 8.9–10.3)
Creatinine, Ser: 0.93 mg/dL (ref 0.44–1.00)
GFR calc Af Amer: >60 mL/min (ref >60)
GFR calc non Af Amer: >60 mL/min (ref >60)
GFR calc non Af Amer: >60 mL/min (ref >60)
GLUCOSE: 191 mg/dL — AB (ref 65–99)
Glucose, Bld: 194 mg/dL — ABNORMAL HIGH (ref 65–99)
POTASSIUM: 2.9 mmol/L — AB (ref 3.5–5.1)
Potassium: 4.3 mmol/L (ref 3.5–5.1)
SODIUM: 143 mmol/L (ref 135–145)
Sodium: 144 mmol/L (ref 135–145)

## 2017-03-19 LAB — CBC
HEMATOCRIT: 34.7 % — AB (ref 36.0–46.0)
HEMOGLOBIN: 12.2 g/dL (ref 12.0–15.0)
MCH: 27.7 pg (ref 26.0–34.0)
MCHC: 35.2 g/dL (ref 30.0–36.0)
MCV: 78.9 fL (ref 78.0–100.0)
Platelets: 158 10*3/uL (ref 150–400)
RBC: 4.4 MIL/uL (ref 3.87–5.11)
RDW: 15.1 % (ref 11.5–15.5)
WBC: 9.8 10*3/uL (ref 4.0–10.5)

## 2017-03-19 LAB — COMPREHENSIVE METABOLIC PANEL
ALK PHOS: 101 U/L (ref 38–126)
ALT: 11 U/L — AB (ref 14–54)
ANION GAP: 8 (ref 5–15)
AST: 15 U/L (ref 15–41)
Albumin: 2 g/dL — ABNORMAL LOW (ref 3.5–5.0)
BILIRUBIN TOTAL: 0.4 mg/dL (ref 0.3–1.2)
BUN: 20 mg/dL (ref 6–20)
CALCIUM: 9.2 mg/dL (ref 8.9–10.3)
CO2: 16 mmol/L — ABNORMAL LOW (ref 22–32)
CREATININE: 0.79 mg/dL (ref 0.44–1.00)
Chloride: 117 mmol/L — ABNORMAL HIGH (ref 101–111)
GFR calc non Af Amer: >60 mL/min (ref >60)
GLUCOSE: 179 mg/dL — AB (ref 65–99)
Potassium: 3.7 mmol/L (ref 3.5–5.1)
Sodium: 141 mmol/L (ref 135–145)
TOTAL PROTEIN: 4.9 g/dL — AB (ref 6.5–8.1)

## 2017-03-19 LAB — HIV ANTIBODY (ROUTINE TESTING W REFLEX): HIV Screen 4th Generation wRfx: NONREACTIVE

## 2017-03-19 LAB — HEMOGLOBIN A1C
Hgb A1c MFr Bld: >15.5 % — ABNORMAL HIGH (ref 4.8–5.6)
Mean Plasma Glucose: >398 mg/dL

## 2017-03-19 LAB — MRSA PCR SCREENING: MRSA BY PCR: NEGATIVE

## 2017-03-19 MED ORDER — LORAZEPAM 2 MG/ML IJ SOLN
1.0000 mg | INTRAMUSCULAR | Status: DC | PRN
  Administered 2017-03-20 – 2017-03-22 (×4): 1 mg via INTRAVENOUS
  Filled 2017-03-19 (×4): qty 1

## 2017-03-19 MED ORDER — SODIUM CHLORIDE 0.9 % IV SOLN
1.0000 mg/h | INTRAVENOUS | Status: DC
  Administered 2017-03-19: 1 mg/h via INTRAVENOUS
  Filled 2017-03-19: qty 10

## 2017-03-19 MED ORDER — SODIUM CHLORIDE 0.9 % IV SOLN
INTRAVENOUS | Status: DC
  Administered 2017-03-19 – 2017-03-21 (×2): via INTRAVENOUS

## 2017-03-19 MED ORDER — SODIUM CHLORIDE 0.9 % IV SOLN
30.0000 meq | Freq: Once | INTRAVENOUS | Status: AC
  Administered 2017-03-19: 30 meq via INTRAVENOUS
  Filled 2017-03-19: qty 15

## 2017-03-19 MED ORDER — MORPHINE SULFATE (PF) 2 MG/ML IV SOLN
2.0000 mg | INTRAVENOUS | Status: DC | PRN
  Administered 2017-03-20 – 2017-03-21 (×5): 4 mg via INTRAVENOUS

## 2017-03-19 MED ORDER — INSULIN GLARGINE 100 UNIT/ML ~~LOC~~ SOLN
14.0000 [IU] | Freq: Once | SUBCUTANEOUS | Status: AC
  Administered 2017-03-19: 14 [IU] via SUBCUTANEOUS
  Filled 2017-03-19: qty 0.14

## 2017-03-19 MED ORDER — HALOPERIDOL LACTATE 5 MG/ML IJ SOLN: 2.0000 mg | Freq: Four times a day (QID) | INTRAMUSCULAR | Status: DC | PRN

## 2017-03-19 MED ORDER — ALBUTEROL SULFATE (2.5 MG/3ML) 0.083% IN NEBU: 2.5000 mg | INHALATION_SOLUTION | RESPIRATORY_TRACT | Status: DC | PRN

## 2017-03-19 NOTE — Progress Notes (Signed)
   03/19/17 1549  Vitals  Temp (!) 96.6 F (35.9 C)  Temp Source Axillary  BP 94/64  BP Location Right Arm  BP Method Automatic  Patient Position (if appropriate) Lying  Pulse Rate 99  Pulse Rate Source Dinamap  Resp (!) 21  Oxygen Therapy  SpO2 94 %  O2 Device Nasal Cannula  O2 Flow Rate (L/min) 3 L/min  Pt received on unit as a transfer from [facility]. Morphine drip running at /hr, pt appears comfortable with her eyes closed. Family at bedside and not voicing any concerns at this time. Will continue to monitor

## 2017-03-19 NOTE — Progress Notes (Signed)
Nutrition Brief Note  Chart reviewed. Pt now transitioning to comfort care.  No further nutrition interventions warranted at this time.  Please re-consult as needed.   Donnalyn Juran A. Antoinetta Berrones, RD, LDN, CDE Pager: [phone] After hours Pager: [phone]  

## 2017-03-19 NOTE — Progress Notes (Signed)
[facility] TEAM 1 - Stepdown/ICU TEAM  Shirley Brady  NWG:[id number] DOB: 07/09/1956 DOA: 04/09/2017 PCP: Pcp Not In System    Brief Narrative:  61 y.o. female former smoker (1PPD x 21yrs) with history of COPD on home oxygen 2L who presented  with altered mental status. At baseline she is normally independent of ADL's. She developed cold like symptoms 2 weeks prior for which she was seen at an urgent care center and put on Zithromax and oral prednisone.She became weaker and the Grandson called the urgent care back and was instructed to take her off antibiotics but leave her on prednisone. She became progressively weaker to the point she had to be lifted off the toilet. She fell on 4/6 and hit her face after tripping on a rug. She became progressively weaker and more confused. She has lost approximately 60 lbs in the last year.  Subjective: Patient has been intermittently agitated over the last 24 hours but is shown no significant evidence of clinical improvement.  At the time of my visit she is sedate.  She does not appear to be in acute distress or suffering with uncontrolled pain.  I spoke at length with the patient's daughter and her husband at the bedside.  All parties agree the patient would wish only for comfort focused care at this time.  Nonetheless her current medical interventions do not appear to be affecting any significant change.  Assessment & Plan:  Goals of Care The decision has been made to transition to full comfort focused care in this patient with end-stage COPD suffering with acute probable aspiration pneumonia.  I will transition her off the insulin drip.  She will be administered as needed medications to assure that she is not anxious, agitated, in pain, or suffering with severe air hunger.  At the present time her trajectory is not clear to me and therefore I will transition her to a palliative care bed to allow 24-48 hours of observation so that she can declare to [place] her  likely life expectancy.  Then we will begin to consider ultimate disposition options.  Sepsis due to Pneumonia  Hyperglycemia - undiagnosed severely uncontrolled DM A1c markedly elevated at > 15.5  Acute on chronic hypoxic and hypercarbic resp failure - End stage COPD   Acute encephalopathy  DVT prophylaxis: lovenox  Code Status: DNR - NO CODE Family Communication: Spoke with daughter and daughter's husband at bedside Disposition Plan: Full comfort focused care - transfer to palliative care bed  Consultants:  PCCM  Procedures: none  Antimicrobials:  Vanco 4/8 > 4/9 Zosyn 4/8 > 4/9  Objective: Blood pressure (!) 89/63, pulse 99, temperature 99.2 F (37.3 C), temperature source Axillary, resp. rate (!) 22, height  (1.6 m), weight 64.9 kg (143 lb), SpO2 95 %.  Intake/Output Summary (Last 24 hours) at 03/19/17 1144 Last data filed at 03/19/17 1000  Gross per 24 hour  Intake          2287.18 ml  Output                0 ml  Net          2287.18 ml   Filed Weights   04/04/2017 0812  Weight: 64.9 kg (143 lb)    Examination: General: No acute respiratory distress - obtunded  Lungs: Minimal air movement appreciable throughout all fields without wheezing Cardiovascular: Regular rate and rhythm - distant heart sounds Abdomen: Nontender, nondistended, soft, bowel sounds positive, no rebound, no ascites, no  appreciable mass Extremities: No significant cyanosis, clubbing, or edema bilateral lower extremities  CBC:  Recent Labs Lab 2017-03-25 0751 03/25/2017 0804 2017/03/25 1921 03/19/17 0737  WBC 29.7*  --  15.2* 9.8  NEUTROABS 24.6*  --   --   --   HGB 15.3* 16.7* 13.8 12.2  HCT 45.3 49.0* 40.3 34.7*  MCV 84.5  --  80.0 78.9  PLT 330  --  221 158   Basic Metabolic Panel:  Recent Labs Lab 03-25-2017 0751 25-Mar-2017 0804 March 25, 2017 1921 03-25-2017 2232 03/19/17 0232 03/19/17 0737  NA 135 131* 142 141 144 141  K 4.0 3.7 2.6* 3.2* 2.9* 3.7  CL 92* 100* 106 110 110 117*    CO2 <7*  --  17* 18* 21* 16*  GLUCOSE 588* 595* 192* 181* 191* 179*  BUN 29* 31* 23* 16 21* 20  CREATININE 1.93* 0.80 1.34* 1.07* 0.93 0.79  CALCIUM 9.5  --  9.3 9.1 9.3 9.2   GFR: Estimated Creatinine Clearance: 67.8 mL/min (by C-G formula based on SCr of 0.79 mg/dL).  Liver Function Tests:  Recent Labs Lab March 25, 2017 0751 03/19/17 0737  AST 16 15  ALT 12* 11*  ALKPHOS 157* 101  BILITOT 2.5* 0.4  PROT 6.9 4.9*  ALBUMIN 3.3* 2.0*    Recent Labs Lab Mar 25, 2017 0751  AMMONIA 32    Cardiac Enzymes:  Recent Labs Lab 2017-03-25 0756  CKTOTAL 25*    HbA1C: Hgb A1c MFr Bld  Date/Time Value Ref Range Status  03/25/2017 07:21 PM >15.5 (H) 4.8 - 5.6 % Final    Comment:    (NOTE) **Verified by repeat analysis**         Pre-diabetes: 5.7 - 6.4         Diabetes: >6.4         Glycemic control for adults with diabetes: <7.0     CBG:  Recent Labs Lab 03/19/17 0655 03/19/17 0758 03/19/17 0858 03/19/17 1000 03/19/17 1106  GLUCAP 166* 164* 166* 168* 167*    Recent Results (from the past 240 hour(s))  Blood Culture (routine x 2)     Status: None (Preliminary result)   Collection Time: 2017/03/25  7:51 AM  Result Value Ref Range Status   Specimen Description BLOOD LEFT HAND  Final   Special Requests IN PEDIATRIC BOTTLE Blood Culture adequate volume  Final   Culture NO GROWTH < 12 HOURS  Final   Report Status PENDING  Incomplete  Blood Culture (routine x 2)     Status: None (Preliminary result)   Collection Time: 03-25-17  8:10 AM  Result Value Ref Range Status   Specimen Description BLOOD RIGHT HAND  Final   Special Requests IN PEDIATRIC BOTTLE Blood Culture adequate volume  Final   Culture NO GROWTH < 12 HOURS  Final   Report Status PENDING  Incomplete     Scheduled Meds: . enoxaparin (LOVENOX) injection  40 mg Subcutaneous Q24H  . ipratropium-albuterol  3 mL Nebulization Q6H  . methylPREDNISolone (SOLU-MEDROL) injection  40 mg Intravenous Q8H  .  piperacillin-tazobactam (ZOSYN)  IV  3.375 g Intravenous Q8H   Continuous Infusions: . sodium chloride    . dextrose 5 % and 0.45% NaCl 125 mL/hr at 03/19/17 0400  . insulin (NOVOLIN-R) infusion 5.4 Units/hr (03/19/17 1000)     LOS: 1 day   Lonia Blood, MD [facility] Office  [phone] Pager - Text Page per Amion as per below:  On-Call/Text Page:      [email or web address]  password [email or web address]  If 7PM-7AM, please contact night-coverage [email or web address] Password [email or web address] 03/19/2017, 11:44 AM

## 2017-03-19 NOTE — Care Management Note (Addendum)
Case Management Note  Patient Details  Name: Shirley Brady MRN: [id number] Date of Birth: April 06, 1956  Subjective/Objective:     Pt admitted with AMS and Falls               Action/Plan:   PTA from home - semi independent.  Pt is now unresponsive but will have PT eval when stable to do so.  CM will continue to follow for discharge needs   Expected Discharge Date:                  Expected Discharge Plan:  Skilled Nursing Facility  In-House Referral:  Clinical Social Work  Discharge planning Services  CM Consult  Post Acute Care Choice:    Choice offered to:     DME Arranged:    DME Agency:     HH Arranged:    HH Agency:     Status of Service:  In process, will continue to follow  If discussed at Long Length of Stay Meetings, dates discussed:    Additional Comments: 03/19/2017 Transitioned to Comfort Care Cherylann Parr, RN 03/19/2017, 10:17 AM

## 2017-03-20 ENCOUNTER — Encounter (HOSPITAL_COMMUNITY): Payer: Self-pay | Admitting: General Practice

## 2017-03-20 DIAGNOSIS — Z7189 Other specified counseling: Secondary | ICD-10-CM

## 2017-03-20 DIAGNOSIS — J189 Pneumonia, unspecified organism: Secondary | ICD-10-CM

## 2017-03-20 DIAGNOSIS — Z515 Encounter for palliative care: Secondary | ICD-10-CM

## 2017-03-20 HISTORY — DX: Pneumonia, unspecified organism: J18.9

## 2017-03-20 NOTE — Consult Note (Signed)
Consultation Note Date: 03/20/2017   Patient Name: Shirley Brady  DOB: May 24, 1956  MRN: [id number]  Age / Sex: 61 y.o., female  PCP: Pcp Not In System Referring Physician: Louellen Molder, MD  Reason for Consultation: Disposition and Terminal Care  HPI/Patient Profile: 61 y.o. female  with past medical history of COPD, DM admitted on 03/26/2017 with respiratory failure secondary to possible aspiration PNA with no improvement despite initiation of abx.  No comfort care.   Clinical Assessment and Goals of Care: I met today with patients daughter. We discussed clinical course as well as palliative, comfort focused care path.  Values and goals important to patient and family were attempted to be elicited.  Concept of Hospice and Palliative Care were discussed  Questions and concerns addressed.   PMT will continue to support holistically.  SUMMARY OF RECOMMENDATIONS   - Full comfort care - Symptoms well controlled.  Continue same - She will likely have hospital death.  Will reassess tomorrow.  If she stabilizes enough to consider transition from the hospital, she would likely best be served by residential hospice.  Her daughter noted [facility].  Code Status/Advance Care Planning:  DNR    Symptom Management:   Well controlled currently.  Continue morphine continuous infusion.  Continue morphine PRN and titrate continuous rate based on PRN usage.  Ativan prn anxiety  Haldol prn agitation  Palliative Prophylaxis:   Frequent Pain Assessment  Additional Recommendations (Limitations, Scope, Preferences):  Full Comfort Care  Psycho-social/Spiritual:   Desire for further Chaplaincy support:no  Additional Recommendations: Education on Hospice  Prognosis:   Hours - Days  Discharge Planning: Anticipated Hospital Death most likely      Primary Diagnoses: Present on  Admission: . Sepsis ([place]) . Community acquired pneumonia . Acute on chronic respiratory failure with hypoxia ([place]) . DKA (diabetic ketoacidoses) ([place]) . Acute metabolic encephalopathy . Leukocytosis . Hyperglycemia . COPD with acute exacerbation ([place])   I have reviewed the medical record, interviewed the patient and family, and examined the patient. The following aspects are pertinent.  Past Medical History:  Diagnosis Date  . COPD (chronic obstructive pulmonary disease) ([place])   . COPD exacerbation ([place])   . DKA (diabetic ketoacidoses) ([place]) 03/17/2017  . Hypertension   . Pneumonia 03/20/2017   Social History   Social History  . Marital status: Married    Spouse name: N/A  . Number of children: N/A  . Years of education: N/A   Social History Main Topics  . Smoking status: Former Smoker    Years: 45.00    Types: Cigarettes  . Smokeless tobacco: Never Used     Comment: QUIT IN 09/2015  . Alcohol use No  . Drug use: No  . Sexual activity: Not Asked   Other Topics Concern  . None   Social History Narrative  . None   History reviewed. No pertinent family history. Scheduled Meds: Continuous Infusions: . sodium chloride 10 mL/hr at 03/19/17 1330  . morphine 3 mg/hr (03/20/17 0559)  PRN Meds:.acetaminophen **OR** acetaminophen, albuterol, haloperidol lactate, LORazepam, morphine injection, ondansetron **OR** ondansetron (ZOFRAN) IV Medications Prior to Admission:  Prior to Admission medications   Medication Sig Start Date End Date Taking? Authorizing Provider  albuterol (PROVENTIL HFA;VENTOLIN HFA) 108 (90 Base) MCG/ACT inhaler Inhale 2 puffs into the lungs every 6 (six) hours as needed for wheezing or shortness of breath.   Yes Historical Provider, MD  esomeprazole (NEXIUM) 40 MG capsule Take 40 mg by mouth daily at 12 noon.   Yes Historical Provider, MD  Fluticasone-Salmeterol (ADVAIR) 500-50 MCG/DOSE AEPB Inhale 1 puff into the lungs 2 (two) times daily.   Yes  Historical Provider, MD  loratadine (CLARITIN) 10 MG tablet Take 10 mg by mouth daily.   Yes Historical Provider, MD  losartan-hydrochlorothiazide (HYZAAR) 100-25 MG tablet Take 1 tablet by mouth daily.   Yes Historical Provider, MD  tiotropium (SPIRIVA) 18 MCG inhalation capsule Place 18 mcg into inhaler and inhale daily.   Yes Historical Provider, MD   Allergies  Allergen Reactions  . Lisinopril    Review of Systems Unable to obtain  Physical Exam  General: Unresponsive, periods of apnea to 8 seconds noted.  HEENT: No bruits, no goiter, no JVD Heart: Tachycardic. No murmur appreciated. Lungs: Poor air movement, + rhonchi Abdomen: Soft, nontender, nondistended, positive bowel sounds.  Ext: No significant edema Skin: Warm and dry  Vital Signs: BP (!) 91/56 (BP Location: Left Arm)   Pulse (!) 120   Temp 98.4 F (36.9 C) (Axillary)   Resp 16   Ht 5' 3"  (1.6 m)   Wt 64.9 kg (143 lb)   SpO2 (!) 85%   BMI 25.33 kg/m  Pain Assessment: PAINAD   Pain Score: Asleep   SpO2: SpO2: (!) 85 % O2 Device:SpO2: (!) 85 % O2 Flow Rate: .O2 Flow Rate (L/min): 3 L/min  IO: Intake/output summary: No intake or output data in the 24 hours ending 03/20/17 2151  LBM: Last BM Date:  (UTA) Baseline Weight: Weight: 64.9 kg (143 lb) Most recent weight: Weight: 64.9 kg (143 lb)     Palliative Assessment/Data:   Flowsheet Rows     Most Recent Value  Intake Tab  Referral Department  Hospitalist  Unit at Time of Referral  ICU  Palliative Care Primary Diagnosis  Pulmonary  Date Notified  03/19/17  Palliative Care Type  New Palliative care  Reason for referral  Clarify Goals of Care  Date of Admission  03/11/2017  Date first seen by Palliative Care  03/20/17  # of days IP prior to Palliative referral  1  Clinical Assessment  Palliative Performance Scale Score  10%  Pain Max last 24 hours  Not able to report  Pain Min Last 24 hours  Not able to report  Psychosocial & Spiritual Assessment   Palliative Care Outcomes  Patient/Family meeting held?  Yes  Who was at the meeting?  Daughter      Time In: 1300 Time Out: 1400 Time Total: 13 Greater than 50%  of this time was spent counseling and coordinating care related to the above assessment and plan.  Signed by: Micheline Rough, MD   Please contact Palliative Medicine Team phone at [phone] for questions and concerns.  For individual provider: See Shea Evans

## 2017-03-20 NOTE — Progress Notes (Signed)
PROGRESS NOTE                                                                                                                                                                                                             Patient Demographics:    Shirley Brady, is a 61 y.o. female, DOB - 1956-10-09, WUJ:[id number]  Admit date - 03/19/2017   Admitting Physician Glade Lloyd, MD  Outpatient Primary MD for the patient is Pcp Not In System  LOS - 2    Chief Complaint  Patient presents with  . Altered Mental Status  . Fall       Brief Narrative   61 year old smoker with history of end-stage COPD presented with altered mental status in the setting of acute on chronic hypercapnic respiratory failure, sepsis due to possible aspiration pneumonia, hyperglycemia with type 2 diabetes mellitus. Patient was admitted to stepdown unit and after discussion with family made comfort care.   Subjective:    Patient on morphine drip with shallow respiration. Unarousable.   Assessment  & Plan :    Sepsis secondary to pneumonia. Guarded prognosis. Now off antibiotics with goal for comfort.  Acute on chronic hypoxic and hypercapnic respiratory failure Secondary to end-stage COPD. Oxygen for comfort. On morphine drip for pain and dyspnea. Anticipated hospital day.  Acute metabolic encephalopathy Combination of hypercapnia, pneumonia and COPD exacerbation.  Hyperglycemia in uncontrolled diabetes mellitus. Was placed on insulin drip on admission , now discontinued.     Code Status : DO NOT RESUSCITATE, comfort measures   Family Communication  : Daughter and son-in-law at bedside   Disposition Plan  : Anticipated hospital death   Barriers For Discharge :   Consults  :   PC CM Palliative care (consult pending)   Procedures  : CT head   DVT Prophylaxis  :None   Lab Results  Component Value Date   PLT 158 03/19/2017     Antibiotics  :   Anti-infectives    Start     Dose/Rate Route Frequency Ordered Stop   03/25/2017 2100  vancomycin (VANCOCIN) IVPB 750 mg/150 ml premix  Status:  Discontinued     750 mg 150 mL/hr over 60 Minutes Intravenous Every 12 hours 03/27/2017 0823 03/11/2017 1853   03/13/2017 1600  piperacillin-tazobactam (ZOSYN) IVPB 3.375 g  Status:  Discontinued  3.375 g 12.5 mL/hr over 240 Minutes Intravenous Every 8 hours 03/27/2017 0823 03/19/17 1232   04/07/2017 0800  piperacillin-tazobactam (ZOSYN) IVPB 3.375 g     3.375 g 100 mL/hr over 30 Minutes Intravenous  Once 03/28/2017 0755 04/07/2017 0907   03/11/2017 0800  vancomycin (VANCOCIN) IVPB 1000 mg/200 mL premix     1,000 mg 200 mL/hr over 60 Minutes Intravenous  Once 03/19/2017 0755 04/09/2017 1009        Objective:   Vitals:   03/19/17 1513 03/19/17 1549 03/19/17 2041 03/20/17 0600  BP: 101/69 94/64 96/66  108/67  Pulse: (!) 102 99 99 (!) 110  Resp: 20 (!) Temp:  (!) 96.6 F (35.9 C) 97.4 F (36.3 C) 97.9 F (36.6 C)  TempSrc: Axillary Axillary Axillary Axillary  SpO2: 93% 94% 92% 94%  Weight:      Height:        Wt Readings from Last 3 Encounters:  04/04/2017 64.9 kg (143 lb)    No intake or output data in the 24 hours ending 03/20/17 1526   Physical Exam  GenNon-arousable, shallow breathing HEENT: Dry oral mucosa, supple neck Chest: Diminished breath sounds bilaterally, scattered rhonchi  CVS: S1 and S2 tachycardic, no murmurs GI: soft, NT, ND, Musculoskeletal: warm, no edema     Data Review:    CBC  Recent Labs Lab 04/09/2017 0751 03/14/2017 0804 04/07/2017 1921 03/19/17 0737  WBC 29.7*  --  15.2* 9.8  HGB 15.3* 16.7* 13.8 12.2  HCT 45.3 49.0* 40.3 34.7*  PLT 330  --  221 158  MCV 84.5  --  80.0 78.9  MCH 28.5  --  27.4 27.7  MCHC 33.8  --  34.2 35.2  RDW 15.1  --  15.3 15.1  LYMPHSABS 2.4  --   --   --   MONOABS 2.7*  --   --   --   EOSABS 0.0  --   --   --   BASOSABS 0.0  --   --   --      Chemistries   Recent Labs Lab 03/16/2017 0751  04/06/2017 1921 03/25/2017 2232 03/19/17 0232 03/19/17 0737 03/19/17 1127  NA 135  < > 142 141 144 141 143  K 4.0  < > 2.6* 3.2* 2.9* 3.7 4.3  CL 92*  < > 106 110 110 117* 116*  CO2 <7*  --  17* 18* 21* 16* 16*  GLUCOSE 588*  < > 192* 181* 191* 179* 194*  BUN 29*  < > 23* 16 21* 20 19  CREATININE 1.93*  < > 1.34* 1.07* 0.93 0.79 0.70  CALCIUM 9.5  --  9.3 9.1 9.3 9.2 9.6  AST 16  --   --   --   --  15  --   ALT 12*  --   --   --   --  11*  --   ALKPHOS 157*  --   --   --   --  101  --   BILITOT 2.5*  --   --   --   --  0.4  --   < > = values in this interval not displayed. ------------------------------------------------------------------------------------------------------------------ No results for input(s): CHOL, HDL, LDLCALC, TRIG, CHOLHDL, LDLDIRECT in the last 72 hours.  Lab Results  Component Value Date   HGBA1C >15.5 (H) 04/09/2017   ------------------------------------------------------------------------------------------------------------------ No results for input(s): TSH, T4TOTAL, T3FREE, THYROIDAB in the last 72 hours.  Invalid input(s): FREET3 ------------------------------------------------------------------------------------------------------------------ No  results for input(s): VITAMINB12, FOLATE, FERRITIN, TIBC, IRON, RETICCTPCT in the last 72 hours.  Coagulation profile No results for input(s): INR, PROTIME in the last 168 hours.  No results for input(s): DDIMER in the last 72 hours.  Cardiac Enzymes No results for input(s): CKMB, TROPONINI, MYOGLOBIN in the last 168 hours.  Invalid input(s): CK ------------------------------------------------------------------------------------------------------------------    Component Value Date/Time   BNP 126.2 (H) Apr 06, 2017 0754    Inpatient Medications  Scheduled Meds: Continuous Infusions: . sodium chloride 10 mL/hr at 03/19/17 1330  . morphine 3 mg/hr  (03/20/17 0559)   PRN Meds:.acetaminophen **OR** acetaminophen, albuterol, haloperidol lactate, LORazepam, morphine injection, ondansetron **OR** ondansetron (ZOFRAN) IV  Micro Results Recent Results (from the past 240 hour(s))  Blood Culture (routine x 2)     Status: None (Preliminary result)   Collection Time: 06-Apr-2017  7:51 AM  Result Value Ref Range Status   Specimen Description BLOOD LEFT HAND  Final   Special Requests IN PEDIATRIC BOTTLE Blood Culture adequate volume  Final   Culture NO GROWTH 2 DAYS  Final   Report Status PENDING  Incomplete  Blood Culture (routine x 2)     Status: None (Preliminary result)   Collection Time: 04/06/17  8:10 AM  Result Value Ref Range Status   Specimen Description BLOOD RIGHT HAND  Final   Special Requests IN PEDIATRIC BOTTLE Blood Culture adequate volume  Final   Culture NO GROWTH 2 DAYS  Final   Report Status PENDING  Incomplete  MRSA PCR Screening     Status: None   Collection Time: 03/19/17  7:00 PM  Result Value Ref Range Status   MRSA by PCR NEGATIVE NEGATIVE Final    Comment:        The GeneXpert MRSA Assay (FDA approved for NASAL specimens only), is one component of a comprehensive MRSA colonization surveillance program. It is not intended to diagnose MRSA infection nor to guide or monitor treatment for MRSA infections.     Radiology Reports Ct Head Wo Contrast  Result Date: 04-06-2017 CLINICAL DATA:  Acute mental status change.  Recent fall. EXAM: CT HEAD WITHOUT CONTRAST TECHNIQUE: Contiguous axial images were obtained from the base of the skull through the vertex without intravenous contrast. COMPARISON:  None. FINDINGS: Brain: No subdural, epidural, or subarachnoid hemorrhage. Ventricles and sulci are unremarkable. Cerebellum, brainstem, and basal cisterns are normal. No acute cortical ischemia or infarct. No mass, mass effect, or midline shift. Vascular: Calcified atherosclerosis is seen in the intracranial carotid arteries.  Skull: Normal. Negative for fracture or focal lesion. Sinuses/Orbits: The paranasal sinuses are normal. There is fluid in the inferior mastoid air cells without bony erosion. Middle ears are well-aerated. Other: None. IMPRESSION: 1. No acute intracranial abnormality. Electronically Signed   By: Gerome Sam III M.D   On: 04-06-2017 16:28   Dg Chest Port 1 View  Result Date: Apr 06, 2017 CLINICAL DATA:  Altered mental status. EXAM: PORTABLE CHEST 1 VIEW COMPARISON:  None. FINDINGS: Bibasilar opacities are seen, right greater than left. The cardiomediastinal silhouette is unremarkable. No pulmonary nodules or masses. IMPRESSION: Right greater than left bibasilar pulmonary opacities. Pneumonia not excluded on this study. Recommend clinical correlation and follow-up to resolution. Electronically Signed   By: Gerome Sam III M.D   On: 04/06/17 08:48    Time Spent in minutes  25   Eddie North M.D on 03/20/2017 at 3:26 PM  Between 7am to 7pm - Pager - [phone]  After 7pm go to [email or web address] - password  [facility] -  Office  [phone]

## 2017-03-21 MED ORDER — MORPHINE SULFATE 20 MG/5ML PO SOLN: 5.0000 mg | ORAL | 0 refills | Status: AC | PRN

## 2017-03-21 MED ORDER — LORAZEPAM 0.5 MG PO TABS: 0.5000 mg | ORAL_TABLET | ORAL | 0 refills | Status: AC | PRN

## 2017-03-21 MED ORDER — HYDROMORPHONE BOLUS VIA INFUSION
1.0000 mg | INTRAVENOUS | Status: DC | PRN
  Administered 2017-03-22 (×3): 1 mg via INTRAVENOUS
  Filled 2017-03-21: qty 1

## 2017-03-21 MED ORDER — SODIUM CHLORIDE 0.9 % IV SOLN
1.0000 mg/h | INTRAVENOUS | Status: DC
  Administered 2017-03-21: 1 mg/h via INTRAVENOUS
  Administered 2017-03-22: 3 mg/h via INTRAVENOUS
  Administered 2017-03-22: 1 mg/h via INTRAVENOUS
  Administered 2017-03-22: 3 mg/h via INTRAVENOUS
  Filled 2017-03-21 (×5): qty 2.5

## 2017-03-21 MED ORDER — ALBUTEROL SULFATE (2.5 MG/3ML) 0.083% IN NEBU: 2.5000 mg | INHALATION_SOLUTION | RESPIRATORY_TRACT | 12 refills | Status: AC | PRN

## 2017-03-21 NOTE — Progress Notes (Signed)
Wasted about 100 ml of morphine drip witnessed by Gregery Na, RN

## 2017-03-21 NOTE — Progress Notes (Signed)
[facility]                                                                              Patient Demographics  Shirley Brady, is a 61 y.o. female, DOB - 06/08/1956, ZOX:[id number]  Admit date - March 28, 2017   Admitting Physician Glade Lloyd, MD  Outpatient Primary MD for the patient is Pcp Not In System  Outpatient specialists:   LOS - 3  days    Chief Complaint  Patient presents with  . Altered Mental Status  . Fall       Brief summary   61 year old smoker with history of end-stage COPD presented with altered mental status in the setting of acute on chronic hypercapnic respiratory failure, sepsis due to possible aspiration pneumonia, hyperglycemia with type 2 diabetes mellitus. Patient was admitted to stepdown unit and after discussion with family made comfort care.   Assessment & Plan   Sepsis secondary to pneumonia. - Guarded prognosis. Currently comfort care, off antibiotics  - Palliative medicine following  Acute on chronic hypoxic and hypercapnic respiratory failure - Secondary to end-stage COPD. - Oxygen for comfort. - Currently on morphine drip for pain and dyspnea  Acute metabolic encephalopathy -Combination of hypercapnia, pneumonia and COPD exacerbation.  Hyperglycemia in uncontrolled diabetes mellitus. -Was placed on insulin drip on admission, now discontinued, comfort care goals.   Code Status:  DO NOT RESUSCITATE DVT Prophylaxis:  Comfort care goals, on morphine drip, actively dying Family Communication: No family member at the bedside  Disposition Plan: Possibility of residential hospice versus hospital death   Time Spent in minutes  15 minutes  Procedures:  CT head  Consultants:   PCCM Palliative medicine  Antimicrobials:      Medications  Scheduled Meds: Continuous Infusions: . sodium chloride 10 mL/hr at 03/19/17 1330  . HYDROmorphone     PRN Meds:.acetaminophen **OR** acetaminophen, albuterol, haloperidol  lactate, HYDROmorphone, LORazepam, ondansetron **OR** ondansetron (ZOFRAN) IV   Antibiotics   Anti-infectives    Start     Dose/Rate Route Frequency Ordered Stop   2017/03/28 2100  vancomycin (VANCOCIN) IVPB 750 mg/150 ml premix  Status:  Discontinued     750 mg 150 mL/hr over 60 Minutes Intravenous Every 12 hours 2017-03-28 0823 Mar 28, 2017 1853   2017-03-28 1600  piperacillin-tazobactam (ZOSYN) IVPB 3.375 g  Status:  Discontinued     3.375 g 12.5 mL/hr over 240 Minutes Intravenous Every 8 hours 03-28-17 0823 03/19/17 1232   2017/03/28 0800  piperacillin-tazobactam (ZOSYN) IVPB 3.375 g     3.375 g 100 mL/hr over 30 Minutes Intravenous  Once March 28, 2017 0755 28-Mar-2017 0907   March 28, 2017 0800  vancomycin (VANCOCIN) IVPB 1000 mg/200 mL premix     1,000 mg 200 mL/hr over 60 Minutes Intravenous  Once 03/28/2017 0755 28-Mar-2017 1009        Subjective:   Shirley Brady was seen and examined today.  Unresponsive on morphine drip  Objective:   Vitals:   03/19/17 2041 03/20/17 0600 03/20/17 2101 03/21/17 0452  BP: 96/66 108/67 (!) 91/56 (!) 90/58  Pulse: 99 (!) 110 (!) 120 (!) 119  Resp: Temp: 97.4  F (36.3 C) 97.9 F (36.6 C) 98.4 F (36.9 C) 98.3 F (36.8 C)  TempSrc: Axillary Axillary Axillary Axillary  SpO2: 92% 94% (!) 85% (!) 86%  Weight:      Height:        Intake/Output Summary (Last 24 hours) at 03/21/17 1329 Last data filed at 03/21/17 0847  Gross per 24 hour  Intake                0 ml  Output                0 ml  Net                0 ml     Wt Readings from Last 3 Encounters:  03/17/2017 64.9 kg (143 lb)     Exam  General: Unresponsive  HEENT:    Neck:   Cardiovascular: S1 S2 auscultated, no rubs, murmurs or gallops. Regular rate and rhythm.  Respiratory: Clear to auscultation bilaterally, no wheezing, rales or rhonchi  Gastrointestinal: Soft, nontender, nondistended, + bowel sounds  Ext: no cyanosis clubbing or edema  Neuro:  Skin: No  rashes  Psych: unresponsive   Data Reviewed:  I have personally reviewed following labs and imaging studies  Micro Results Recent Results (from the past 240 hour(s))  Blood Culture (routine x 2)     Status: None (Preliminary result)   Collection Time: 03/12/2017  7:51 AM  Result Value Ref Range Status   Specimen Description BLOOD LEFT HAND  Final   Special Requests IN PEDIATRIC BOTTLE Blood Culture adequate volume  Final   Culture NO GROWTH 3 DAYS  Final   Report Status PENDING  Incomplete  Blood Culture (routine x 2)     Status: None (Preliminary result)   Collection Time: 03/20/2017  8:10 AM  Result Value Ref Range Status   Specimen Description BLOOD RIGHT HAND  Final   Special Requests IN PEDIATRIC BOTTLE Blood Culture adequate volume  Final   Culture NO GROWTH 3 DAYS  Final   Report Status PENDING  Incomplete  MRSA PCR Screening     Status: None   Collection Time: 03/19/17  7:00 PM  Result Value Ref Range Status   MRSA by PCR NEGATIVE NEGATIVE Final    Comment:        The GeneXpert MRSA Assay (FDA approved for NASAL specimens only), is one component of a comprehensive MRSA colonization surveillance program. It is not intended to diagnose MRSA infection nor to guide or monitor treatment for MRSA infections.     Radiology Reports Ct Head Wo Contrast  Result Date: 04/06/2017 CLINICAL DATA:  Acute mental status change.  Recent fall. EXAM: CT HEAD WITHOUT CONTRAST TECHNIQUE: Contiguous axial images were obtained from the base of the skull through the vertex without intravenous contrast. COMPARISON:  None. FINDINGS: Brain: No subdural, epidural, or subarachnoid hemorrhage. Ventricles and sulci are unremarkable. Cerebellum, brainstem, and basal cisterns are normal. No acute cortical ischemia or infarct. No mass, mass effect, or midline shift. Vascular: Calcified atherosclerosis is seen in the intracranial carotid arteries. Skull: Normal. Negative for fracture or focal lesion.  Sinuses/Orbits: The paranasal sinuses are normal. There is fluid in the inferior mastoid air cells without bony erosion. Middle ears are well-aerated. Other: None. IMPRESSION: 1. No acute intracranial abnormality. Electronically Signed   By: Gerome Sam III M.D   On: 03/17/2017 16:28   Dg Chest Port 1 View  Result Date: 03/28/2017 CLINICAL DATA:  Altered mental  status. EXAM: PORTABLE CHEST 1 VIEW COMPARISON:  None. FINDINGS: Bibasilar opacities are seen, right greater than left. The cardiomediastinal silhouette is unremarkable. No pulmonary nodules or masses. IMPRESSION: Right greater than left bibasilar pulmonary opacities. Pneumonia not excluded on this study. Recommend clinical correlation and follow-up to resolution. Electronically Signed   By: Gerome Sam III M.D   On: 04-09-2017 08:48    Lab Data:  CBC:  Recent Labs Lab April 09, 2017 0751 04-09-2017 0804 04-09-17 1921 03/19/17 0737  WBC 29.7*  --  15.2* 9.8  NEUTROABS 24.6*  --   --   --   HGB 15.3* 16.7* 13.8 12.2  HCT 45.3 49.0* 40.3 34.7*  MCV 84.5  --  80.0 78.9  PLT 330  --  221 158   Basic Metabolic Panel:  Recent Labs Lab 09-Apr-2017 1921 2017-04-09 2232 03/19/17 0232 03/19/17 0737 03/19/17 1127  NA 142 141 144 141 143  K 2.6* 3.2* 2.9* 3.7 4.3  CL 106 110 110 117* 116*  CO2 17* 18* 21* 16* 16*  GLUCOSE 192* 181* 191* 179* 194*  BUN 23* 16 21* 20 19  CREATININE 1.34* 1.07* 0.93 0.79 0.70  CALCIUM 9.3 9.1 9.3 9.2 9.6   GFR: Estimated Creatinine Clearance: 67.8 mL/min (by C-G formula based on SCr of 0.7 mg/dL). Liver Function Tests:  Recent Labs Lab 09-Apr-2017 0751 03/19/17 0737  AST 16 15  ALT 12* 11*  ALKPHOS 157* 101  BILITOT 2.5* 0.4  PROT 6.9 4.9*  ALBUMIN 3.3* 2.0*   No results for input(s): LIPASE, AMYLASE in the last 168 hours.  Recent Labs Lab 2017-04-09 0751  AMMONIA 32   Coagulation Profile: No results for input(s): INR, PROTIME in the last 168 hours. Cardiac Enzymes:  Recent Labs Lab  2017/04/09 0756  CKTOTAL 25*   BNP (last 3 results) No results for input(s): PROBNP in the last 8760 hours. HbA1C:  Recent Labs  2017-04-09 1921  HGBA1C >15.5*   CBG:  Recent Labs Lab 03/19/17 1000 03/19/17 1106 03/19/17 1207 03/19/17 1324 03/19/17 1512  GLUCAP 168* 167* 184* 193* 184*   Lipid Profile: No results for input(s): CHOL, HDL, LDLCALC, TRIG, CHOLHDL, LDLDIRECT in the last 72 hours. Thyroid Function Tests: No results for input(s): TSH, T4TOTAL, FREET4, T3FREE, THYROIDAB in the last 72 hours. Anemia Panel: No results for input(s): VITAMINB12, FOLATE, FERRITIN, TIBC, IRON, RETICCTPCT in the last 72 hours. Urine analysis:    Component Value Date/Time   COLORURINE YELLOW Apr 09, 2017 0928   APPEARANCEUR HAZY (A) 04-09-17 0928   LABSPEC 1.023 April 09, 2017 0928   PHURINE 5.0 09-Apr-2017 0928   GLUCOSEU >=500 (A) Apr 09, 2017 0928   HGBUR SMALL (A) 04/09/2017 0928   BILIRUBINUR NEGATIVE 04/09/2017 0928   KETONESUR 80 (A) 09-Apr-2017 0928   PROTEINUR 30 (A) 2017/04/09 0928   NITRITE NEGATIVE Apr 09, 2017 0928   LEUKOCYTESUR NEGATIVE Apr 09, 2017 0928     Sondos Wolfman M.D. [facility] 03/21/2017, 1:29 PM  Pager: [phone] Between 7am to 7pm - call Pager - [phone]  After 7pm go to [email or web address] - password [email or web address]  Call night coverage person covering after 7pm

## 2017-03-21 NOTE — Progress Notes (Signed)
Daily Progress Note   Patient Name: Shirley Brady       Date: 03/21/2017 DOB: Jul 22, 1956  Age: 61 y.o. MRN#: [id number] Attending Physician: Mendel Corning, MD Primary Care Physician: Pcp Not In System Admit Date: 03/28/2017  Reason for Consultation/Follow-up: Non pain symptom management, Pain control, Psychosocial/spiritual support and Terminal Care  Subjective: Met today with patient's daughter and grandson.  Her daughter reports that her mother has been receiving excellent care, however, she is concerned with increasing symptom burden this morning.  She does not feel that the morphine is effective (she will rest for awhile after getting a dose, but then moaning and agitation recur) and requested changing to a different medication.   Length of Stay: 3  Current Medications: Scheduled Meds:    Continuous Infusions: . sodium chloride 10 mL/hr at 03/19/17 1330  . HYDROmorphone      PRN Meds: acetaminophen **OR** acetaminophen, albuterol, haloperidol lactate, HYDROmorphone, LORazepam, ondansetron **OR** ondansetron (ZOFRAN) IV  Physical Exam         General: Unresponsive, periods of apnea noted.  HEENT: No bruits, no goiter, no JVD Heart: Tachycardic. No murmur appreciated. Lungs: Poor air movement, + rhonchi Abdomen: Soft, nontender, nondistended, positive bowel sounds.  Ext: No significant edema Skin: mottling noted  Vital Signs: BP (!) 90/58 (BP Location: Left Arm)   Pulse (!) 119   Temp 98.3 F (36.8 C) (Axillary)   Resp 15   Ht 5' 3" (1.6 m)   Wt 64.9 kg (143 lb)   SpO2 (!) 86%   BMI 25.33 kg/m  SpO2: SpO2: (!) 86 % O2 Device: O2 Device: Nasal Cannula O2 Flow Rate: O2 Flow Rate (L/min): 3 L/min  Intake/output summary:  Intake/Output Summary (Last 24 hours) at 03/21/17  1352 Last data filed at 03/21/17 0847  Gross per 24 hour  Intake                0 ml  Output                0 ml  Net                0 ml   LBM: Last BM Date:  (UTA) Baseline Weight: Weight: 64.9 kg (143 lb) Most recent weight: Weight: 64.9 kg (143 lb)  Palliative Assessment/Data:    Flowsheet Rows     Most Recent Value  Intake Tab  Referral Department  Hospitalist  Unit at Time of Referral  ICU  Palliative Care Primary Diagnosis  Pulmonary  Date Notified  03/19/17  Palliative Care Type  New Palliative care  Reason for referral  Clarify Goals of Care  Date of Admission  04/06/2017  Date first seen by Palliative Care  03/20/17  # of days IP prior to Palliative referral  1  Clinical Assessment  Palliative Performance Scale Score  10%  Pain Max last 24 hours  Not able to report  Pain Min Last 24 hours  Not able to report  Psychosocial & Spiritual Assessment  Palliative Care Outcomes  Patient/Family meeting held?  Yes  Who was at the meeting?  Daughter      Patient Active Problem List   Diagnosis Date Noted  . Sepsis ([place]) 03/23/2017  . Community acquired pneumonia 04/07/2017  . Acute on chronic respiratory failure with hypoxia ([place]) 04/01/2017  . DKA (diabetic ketoacidoses) ([place]) 03/14/2017  . Acute metabolic encephalopathy 98/33/8250  . Leukocytosis 03/16/2017  . Hyperglycemia 03/27/2017  . Acute respiratory failure with hypoxia and hypercapnia ([facility])   . COPD with acute exacerbation ([place])   . Altered mental status     Palliative Care Assessment & Plan   Patient Profile: 61 y.o. female  with past medical history of COPD, DM admitted on 03/15/2017 with respiratory failure secondary to possible aspiration PNA with no improvement despite initiation of abx.  Now comfort care.   Recommendations/Plan:  Pain/SOB: Her daughter reports increase in restlessness and moaning.  She has had several doses rescue medication as well as increase in her continuous infusion  rate.  Family does not feel morphine "is working like it was" and requested transition to another medication.  With her quickly increasing symptom burden and need for frequent PRN, I think that rotation to another opioid is reasonable.  Transition to dilaudid 6m/hr with additional breakthrough 180mevery 30 mins as needed.  Continue to titrate to ensure adequate symptom management.  Goals of Care and Additional Recommendations:  Limitations on Scope of Treatment: Full Comfort Care  Code Status:    Code Status Orders        Start     Ordered   03/28/2017 1853  Do not attempt resuscitation (DNR)  Continuous    Question Answer Comment  In the event of cardiac or respiratory ARREST Do not call a "code blue"   In the event of cardiac or respiratory ARREST Do not perform Intubation, CPR, defibrillation or ACLS   In the event of cardiac or respiratory ARREST Use medication by any route, position, wound care, and other measures to relive pain and suffering. May use oxygen, suction and manual treatment of airway obstruction as needed for comfort.      04/07/2017 1853    Code Status History    Date Active Date Inactive Code Status Order ID Comments User Context   03/13/2017 11:47 AM 03/24/2017  6:53 PM DNR [id number] BrooksNP ED    Advance Directive Documentation     Most Recent Value  Type of Advance Directive  Healthcare Power of Attorney  Pre-existing out of facility DNR order (yellow form or pink MOST form)  -  "MOST" Form in Place?  -       Prognosis:   Hours - Days  Discharge Planning:  Anticipated Hospital Death (vs residential hospice). Today,  she does not appear stable to transfer and she has been having increasing symptom burden. If she stabilizes enough to consider transition from the hospital, she would best be served by residential hospice.  Her daughter noted [facility].    Care plan was discussed with patient's daughter, RN  Thank you for allowing  the Palliative Medicine Team to assist in the care of this patient.   Time In: 1300 Time Out: 1330 Total Time 30 Prolonged Time Billed No      Greater than 50%  of this time was spent counseling and coordinating care related to the above assessment and plan.  Micheline Rough, MD  Please contact Palliative Medicine Team phone at [phone] for questions and concerns.

## 2017-03-23 LAB — CULTURE, BLOOD (ROUTINE X 2)
CULTURE: NO GROWTH
Culture: NO GROWTH
SPECIAL REQUESTS: ADEQUATE
Special Requests: ADEQUATE

## 2017-04-10 NOTE — Progress Notes (Signed)
[facility]                                                                              Patient Demographics  Shirley Brady, is a 61 y.o. female, DOB - June 09, 1956, ZOX:[id number]  Admit date - 03/27/2017   Admitting Physician Glade Lloyd, MD  Outpatient Primary MD for the patient is Pcp Not In System  Outpatient specialists:   LOS - 4  days    Chief Complaint  Patient presents with  . Altered Mental Status  . Fall       Brief summary   61 year old smoker with history of end-stage COPD presented with altered mental status in the setting of acute on chronic hypercapnic respiratory failure, sepsis due to possible aspiration pneumonia, hyperglycemia with type 2 diabetes mellitus. Patient was admitted to stepdown unit and after discussion with family made comfort care.   Assessment & Plan   Sepsis secondary to pneumonia. - Actively dying, expecting hospital death. Currently comfort care, off antibiotics  - Palliative medicine following  Acute on chronic hypoxic and hypercapnic respiratory failure - Secondary to end-stage COPD. - Oxygen for comfort. - Currently on Hydromorphone drip for pain and dyspnea  Acute metabolic encephalopathy -Combination of hypercapnia, pneumonia and COPD exacerbation.  Hyperglycemia in uncontrolled diabetes mellitus. -Was placed on insulin drip on admission, now discontinued, comfort care goals.  Code Status:  DO NOT RESUSCITATE DVT Prophylaxis:  Comfort care goals, on morphine drip, actively dying Family Communication: Multiple family members at the bedside  Disposition Plan:  hospital death   Time Spent in minutes  15 minutes  Procedures:  CT head  Consultants:   PCCM Palliative medicine  Antimicrobials:      Medications  Scheduled Meds: Continuous Infusions: . sodium chloride 10 mL/hr at 03/21/17 2211  . HYDROmorphone 3 mg/hr (Apr 04, 2017 0829)   PRN Meds:.acetaminophen **OR** acetaminophen, albuterol,  haloperidol lactate, HYDROmorphone, LORazepam, ondansetron **OR** ondansetron (ZOFRAN) IV   Antibiotics   Anti-infectives    Start     Dose/Rate Route Frequency Ordered Stop   03/28/2017 2100  vancomycin (VANCOCIN) IVPB 750 mg/150 ml premix  Status:  Discontinued     750 mg 150 mL/hr over 60 Minutes Intravenous Every 12 hours 04/03/2017 0823 03/19/2017 1853   03/13/2017 1600  piperacillin-tazobactam (ZOSYN) IVPB 3.375 g  Status:  Discontinued     3.375 g 12.5 mL/hr over 240 Minutes Intravenous Every 8 hours 03/12/2017 0823 03/19/17 1232   03/29/2017 0800  piperacillin-tazobactam (ZOSYN) IVPB 3.375 g     3.375 g 100 mL/hr over 30 Minutes Intravenous  Once 03/27/2017 0755 03/12/2017 0907   03/15/2017 0800  vancomycin (VANCOCIN) IVPB 1000 mg/200 mL premix     1,000 mg 200 mL/hr over 60 Minutes Intravenous  Once 04/04/2017 0755 03/17/2017 1009        Subjective:   Shirley Brady was seen and examined today.  Unresponsive on Dilaudid drip, shallow breathing,  Objective:   Vitals:   03/21/17 0452 Apr 04, 2017 0625 2017/04/04 1015 04-Apr-2017 1450  BP: (!) 90/58 (!) 64/37    Pulse: (!) 119 (!) 122    Resp: 15  (!) 28 (!) 24  Temp: 98.3 F (36.8 C) 98.6 F (37 C)    TempSrc: Axillary Axillary    SpO2: (!) 86% (!) 83%    Weight:      Height:        Intake/Output Summary (Last 24 hours) at 04/06/2017 1516 Last data filed at 03/16/2017 0432  Gross per 24 hour  Intake           114.24 ml  Output                0 ml  Net           114.24 ml     Wt Readings from Last 3 Encounters:  Apr 10, 2017 64.9 kg (143 lb)     Exam  General: Unresponsive  HEENT:    Neck:   Cardiovascular: S1 S2 Clear, RRR  Respiratory: Rhonchorous  Gastrointestinal:   Ext:   Neuro:  Skin: No rashes  Psych: unresponsive   Data Reviewed:  I have personally reviewed following labs and imaging studies  Micro Results Recent Results (from the past 240 hour(s))  Blood Culture (routine x 2)     Status: None (Preliminary  result)   Collection Time: 04/10/17  7:51 AM  Result Value Ref Range Status   Specimen Description BLOOD LEFT HAND  Final   Special Requests IN PEDIATRIC BOTTLE Blood Culture adequate volume  Final   Culture NO GROWTH 4 DAYS  Final   Report Status PENDING  Incomplete  Blood Culture (routine x 2)     Status: None (Preliminary result)   Collection Time: Apr 10, 2017  8:10 AM  Result Value Ref Range Status   Specimen Description BLOOD RIGHT HAND  Final   Special Requests IN PEDIATRIC BOTTLE Blood Culture adequate volume  Final   Culture NO GROWTH 4 DAYS  Final   Report Status PENDING  Incomplete  MRSA PCR Screening     Status: None   Collection Time: 03/19/17  7:00 PM  Result Value Ref Range Status   MRSA by PCR NEGATIVE NEGATIVE Final    Comment:        The GeneXpert MRSA Assay (FDA approved for NASAL specimens only), is one component of a comprehensive MRSA colonization surveillance program. It is not intended to diagnose MRSA infection nor to guide or monitor treatment for MRSA infections.     Radiology Reports Ct Head Wo Contrast  Result Date: 04/10/17 CLINICAL DATA:  Acute mental status change.  Recent fall. EXAM: CT HEAD WITHOUT CONTRAST TECHNIQUE: Contiguous axial images were obtained from the base of the skull through the vertex without intravenous contrast. COMPARISON:  None. FINDINGS: Brain: No subdural, epidural, or subarachnoid hemorrhage. Ventricles and sulci are unremarkable. Cerebellum, brainstem, and basal cisterns are normal. No acute cortical ischemia or infarct. No mass, mass effect, or midline shift. Vascular: Calcified atherosclerosis is seen in the intracranial carotid arteries. Skull: Normal. Negative for fracture or focal lesion. Sinuses/Orbits: The paranasal sinuses are normal. There is fluid in the inferior mastoid air cells without bony erosion. Middle ears are well-aerated. Other: None. IMPRESSION: 1. No acute intracranial abnormality. Electronically Signed    By: Gerome Sam III M.D   On: 04/10/2017 16:28   Dg Chest Port 1 View  Result Date: 2017/04/10 CLINICAL DATA:  Altered mental status. EXAM: PORTABLE CHEST 1 VIEW COMPARISON:  None. FINDINGS: Bibasilar opacities are seen, right greater than left. The cardiomediastinal silhouette is unremarkable. No pulmonary nodules or masses. IMPRESSION: Right greater than left bibasilar pulmonary opacities. Pneumonia not excluded  on this study. Recommend clinical correlation and follow-up to resolution. Electronically Signed   By: Gerome Sam III M.D   On: 04-10-17 08:48    Lab Data:  CBC:  Recent Labs Lab 04-10-2017 0751 04-10-17 0804 04-10-17 1921 03/19/17 0737  WBC 29.7*  --  15.2* 9.8  NEUTROABS 24.6*  --   --   --   HGB 15.3* 16.7* 13.8 12.2  HCT 45.3 49.0* 40.3 34.7*  MCV 84.5  --  80.0 78.9  PLT 330  --  221 158   Basic Metabolic Panel:  Recent Labs Lab 2017/04/10 1921 2017/04/10 2232 03/19/17 0232 03/19/17 0737 03/19/17 1127  NA 142 141 144 141 143  K 2.6* 3.2* 2.9* 3.7 4.3  CL 106 110 110 117* 116*  CO2 17* 18* 21* 16* 16*  GLUCOSE 192* 181* 191* 179* 194*  BUN 23* 16 21* 20 19  CREATININE 1.34* 1.07* 0.93 0.79 0.70  CALCIUM 9.3 9.1 9.3 9.2 9.6   GFR: Estimated Creatinine Clearance: 67.8 mL/min (by C-G formula based on SCr of 0.7 mg/dL). Liver Function Tests:  Recent Labs Lab 04/10/17 0751 03/19/17 0737  AST 16 15  ALT 12* 11*  ALKPHOS 157* 101  BILITOT 2.5* 0.4  PROT 6.9 4.9*  ALBUMIN 3.3* 2.0*   No results for input(s): LIPASE, AMYLASE in the last 168 hours.  Recent Labs Lab 10-Apr-2017 0751  AMMONIA 32   Coagulation Profile: No results for input(s): INR, PROTIME in the last 168 hours. Cardiac Enzymes:  Recent Labs Lab 04-10-2017 0756  CKTOTAL 25*   BNP (last 3 results) No results for input(s): PROBNP in the last 8760 hours. HbA1C: No results for input(s): HGBA1C in the last 72 hours. CBG:  Recent Labs Lab 03/19/17 1000 03/19/17 1106  03/19/17 1207 03/19/17 1324 03/19/17 1512  GLUCAP 168* 167* 184* 193* 184*   Lipid Profile: No results for input(s): CHOL, HDL, LDLCALC, TRIG, CHOLHDL, LDLDIRECT in the last 72 hours. Thyroid Function Tests: No results for input(s): TSH, T4TOTAL, FREET4, T3FREE, THYROIDAB in the last 72 hours. Anemia Panel: No results for input(s): VITAMINB12, FOLATE, FERRITIN, TIBC, IRON, RETICCTPCT in the last 72 hours. Urine analysis:    Component Value Date/Time   COLORURINE YELLOW 04-10-2017 0928   APPEARANCEUR HAZY (A) 04/10/17 0928   LABSPEC 1.023 2017-04-10 0928   PHURINE 5.0 04-10-2017 0928   GLUCOSEU >=500 (A) 04-10-2017 0928   HGBUR SMALL (A) Apr 10, 2017 0928   BILIRUBINUR NEGATIVE 04/10/2017 0928   KETONESUR 80 (A) Apr 10, 2017 0928   PROTEINUR 30 (A) Apr 10, 2017 0928   NITRITE NEGATIVE 2017-04-10 0928   LEUKOCYTESUR NEGATIVE 2017-04-10 0928     Selinda Korzeniewski M.D. [facility] 03/20/2017, 3:16 PM  Pager: [phone] Between 7am to 7pm - call Pager - [phone]  After 7pm go to [email or web address] - password [email or web address]  Call night coverage person covering after 7pm

## 2017-04-10 NOTE — Progress Notes (Signed)
Daily Progress Note   Patient Name: Shirley Brady       Date: Mar 27, 2017 DOB: 1956-01-21  Age: 61 y.o. MRN#: [id number] Attending Physician: Mendel Corning, MD Primary Care Physician: Pcp Not In System Admit Date: 04/02/2017  Reason for Consultation/Follow-up: Non pain symptom management, Pain control, Psychosocial/spiritual support and Terminal Care  Subjective: Met today with patient's family (including daughter).  She continues to progress in the process of actively dying.   Length of Stay: 4  Current Medications: Scheduled Meds:    Continuous Infusions: . sodium chloride 10 mL/hr at 03/21/17 2211  . HYDROmorphone 3 mg/hr (2017/03/27 0829)    PRN Meds: acetaminophen **OR** acetaminophen, albuterol, haloperidol lactate, HYDROmorphone, LORazepam, ondansetron **OR** ondansetron (ZOFRAN) IV  Physical Exam         General: Unresponsive, periods of apnea noted.  HEENT: No bruits, no goiter, no JVD Heart: Tachycardic. No murmur appreciated. Lungs: Poor air movement, + rhonchi Abdomen: Soft, nontender, nondistended, positive bowel sounds.  Ext: No significant edema Skin: mottling noted  Vital Signs: BP (!) 64/37 (BP Location: Right Arm)   Pulse (!) 122   Temp 98.6 F (37 C) (Axillary)   Resp (!) 28 Comment: Bolus given  Ht _0  (1.6 m)   Wt 64.9 kg (143 lb)   SpO2 (!) 83%   BMI 25.33 kg/m  SpO2: SpO2: (!) 83 % O2 Device: O2 Device: Nasal Cannula O2 Flow Rate: O2 Flow Rate (L/min): 3 L/min  Intake/output summary:   Intake/Output Summary (Last 24 hours) at 03-27-2017 1024 Last data filed at 03-27-17 8403  Gross per 24 hour  Intake           114.24 ml  Output                0 ml  Net           114.24 ml   LBM: Last BM Date:  (UTA) Baseline Weight: Weight: 64.9 kg (143  lb) Most recent weight: Weight: 64.9 kg (143 lb)       Palliative Assessment/Data:    Flowsheet Rows     Most Recent Value  Intake Tab  Referral Department  Hospitalist  Unit at Time of Referral  ICU  Palliative Care Primary Diagnosis  Pulmonary  Date Notified  03/19/17  Palliative Care Type  New Palliative care  Reason for referral  Clarify Goals of Care  Date of Admission  03/27/2017  Date first seen by Palliative Care  03/20/17  # of days IP prior to Palliative referral  1  Clinical Assessment  Palliative Performance Scale Score  10%  Pain Max last 24 hours  Not able to report  Pain Min Last 24 hours  Not able to report  Psychosocial & Spiritual Assessment  Palliative Care Outcomes  Patient/Family meeting held?  Yes  Who was at the meeting?  Daughter      Patient Active Problem List   Diagnosis Date Noted  . Sepsis ([place]) 03/23/2017  . Community acquired pneumonia 03/14/2017  . Acute on chronic respiratory failure with hypoxia ([place]) 03/27/2017  . DKA (diabetic ketoacidoses) ([place]) 04/01/2017  . Acute metabolic encephalopathy 45/62/5638  . Leukocytosis 03/19/2017  . Hyperglycemia 03/23/2017  . Acute respiratory failure with hypoxia and hypercapnia ([facility])   . COPD with acute exacerbation ([place])   . Altered mental status     Palliative Care Assessment & Plan   Patient Profile: 61 y.o. female  with past medical history of COPD, DM admitted on 03/21/2017 with respiratory failure secondary to possible aspiration PNA with no improvement despite initiation of abx.  Now comfort care.   Recommendations/Plan:  Pain/SOB: Well controlled this AM on dilaudid infusion.  Continue to titrate to ensure adequate symptom management.  Goals of Care and Additional Recommendations:  Limitations on Scope of Treatment: Full Comfort Care  Code Status:    Code Status Orders        Start     Ordered   03/15/2017 1853  Do not attempt resuscitation (DNR)  Continuous    Question Answer  Comment  In the event of cardiac or respiratory ARREST Do not call a "code blue"   In the event of cardiac or respiratory ARREST Do not perform Intubation, CPR, defibrillation or ACLS   In the event of cardiac or respiratory ARREST Use medication by any route, position, wound care, and other measures to relive pain and suffering. May use oxygen, suction and manual treatment of airway obstruction as needed for comfort.      03/11/2017 1853    Code Status History    Date Active Date Inactive Code Status Order ID Comments User Context   04/05/2017 11:47 AM 03/11/2017  6:53 PM DNR [id number]  Donita Brooks, NP ED    Advance Directive Documentation     Most Recent Value  Type of Advance Directive  Healthcare Power of Attorney  Pre-existing out of facility DNR order (yellow form or pink MOST form)  -  "MOST" Form in Place?  -       Prognosis:   Most likely hours at this point.  Discharge Planning:  Anticipated Hospital Death. She continues to decline.  Tachycardic with BP continuing to drop overnight.  Noted this AM 67/34.  I cannot palpate peripheral pulses at time of my encounter.      Care plan was discussed with patient's daughter, RN  Thank you for allowing the Palliative Medicine Team to assist in the care of this patient.   Total Time 15 Prolonged Time Billed No      Greater than 50%  of this time was spent counseling and coordinating care related to the above assessment and plan.  Micheline Rough, MD  Please contact Palliative Medicine Team phone at [phone] for questions and concerns.

## 2017-04-10 NOTE — Progress Notes (Signed)
Visited with patient and family at bedside to support Daughter and grandson. Per patient nurse; patient is actively dying and will most likely pass in a few hours. Daughter asked that I pray for patient as she is transitioning. Daughter was specific about content of prayer.  As they joined me this appeared to bring family comfort, peace and acceptance.  I explored with family regarding any other immediate needs there were none at moment. Family was advised that chaplain support is available as needed. Provided empathetic listening, ministry of presence, emotional and grief support.   2017-04-19 1615  Clinical Encounter Type  Visited With Patient and family together;Health care provider  Visit Type Initial;Spiritual support;Patient actively dying  Referral From Nurse  Consult/Referral To Chaplain  Spiritual Encounters  Spiritual Needs Prayer;Emotional;Grief support  Stress Factors  Family Stress Factors Loss;Major life changes  Venida Jarvis, Chaplain,pager [phone]

## 2017-04-10 NOTE — Plan of Care (Signed)
Family contacted RN that patient was please from her mouth. RN arrived to find patient taking lasts breaths.  Daughter consoled and prayed with. 2nd RN confirmed time of death to be 05/06/1631.  Patient cleaned up to be presentable for other family to see.  Dr. And donor services notified.

## 2017-04-10 NOTE — Death Summary Note (Signed)
DEATH SUMMARY   Patient Details  Name: Shirley Brady MRN: [id number] DOB: March 10, 1956  Admission/Discharge Information   Admit Date:  03-30-17  Date of Death: Date of Death: 04-03-2017  Time of Death: Time of Death: 1631/04/23  Length of Stay: 4  Referring Physician: Pcp Not In System   Reason(s) for Hospitalization  61 year old smoker with history of end-stage COPD presented with altered mental status in the setting of acute on chronic hypercapnic respiratory failure, sepsis due to possible aspiration pneumonia, hyperglycemia with type 2 diabetes mellitus. Patient was admitted to stepdown unit and after discussion with family made comfort care.  Diagnoses  Preliminary cause of death: Acute on chronic respiratory failure (HCC) Secondary Diagnoses (including complications and co-morbidities):     Community acquired pneumonia Active Problems:   Sepsis (HCC)   COPD with acute exacerbation (HCC)   Acute on chronic respiratory failure with hypoxia (HCC)   DKA (diabetic ketoacidoses) (HCC)   Acute metabolic encephalopathy   Leukocytosis   Hyperglycemia   Brief Hospital Course (including significant findings, care, treatment, and services provided and events leading to death)  Shirley Brady is a 61 y.o. year old female with history of end-stage COPD,smoking, chronic respiratory failure presented with altered mental status in the setting of acute on chronic hypercapnic respiratory failure, sepsis due to possible aspiration pneumonia, hyperglycemia with type 2 diabetes mellitus. Patient was admitted to stepdown unit and after discussion with family made comfort care.  Sepsis secondary to bibasilar pneumonia. - Patient was seen by critical care in ED. She was placed on broad spectrum antibiotics, brovana, nebulizers, steroids with no significant improvement. After discussion with family was made comfort care. Palliative medicine was consulted.  - Patient was placed on hydromorphone drip, off  antibiotics   Acute on chronic hypoxic and hypercapnic respiratory failure - Secondary to end-stage COPD, bibasilar pneumonia. - Oxygen for comfort. -  on Hydromorphone drip for pain and dyspnea  Acute metabolic encephalopathy -Combination of hypercapnia, pneumonia and COPD exacerbation.  Hyperglycemia in uncontrolled diabetes mellitus. -Was placed on insulin drip on admission,then discontinued with comfort care goals  Patient passed on 03-Apr-2017 at 4:32pm.   Pertinent Labs and Studies  Significant Diagnostic Studies Ct Head Wo Contrast  Result Date: 2017/03/30 CLINICAL DATA:  Acute mental status change.  Recent fall. EXAM: CT HEAD WITHOUT CONTRAST TECHNIQUE: Contiguous axial images were obtained from the base of the skull through the vertex without intravenous contrast. COMPARISON:  None. FINDINGS: Brain: No subdural, epidural, or subarachnoid hemorrhage. Ventricles and sulci are unremarkable. Cerebellum, brainstem, and basal cisterns are normal. No acute cortical ischemia or infarct. No mass, mass effect, or midline shift. Vascular: Calcified atherosclerosis is seen in the intracranial carotid arteries. Skull: Normal. Negative for fracture or focal lesion. Sinuses/Orbits: The paranasal sinuses are normal. There is fluid in the inferior mastoid air cells without bony erosion. Middle ears are well-aerated. Other: None. IMPRESSION: 1. No acute intracranial abnormality. Electronically Signed   By: Gerome Sam III M.D   On: 2017-03-30 16:28   Dg Chest Port 1 View  Result Date: March 30, 2017 CLINICAL DATA:  Altered mental status. EXAM: PORTABLE CHEST 1 VIEW COMPARISON:  None. FINDINGS: Bibasilar opacities are seen, right greater than left. The cardiomediastinal silhouette is unremarkable. No pulmonary nodules or masses. IMPRESSION: Right greater than left bibasilar pulmonary opacities. Pneumonia not excluded on this study. Recommend clinical correlation and follow-up to resolution. Electronically  Signed   By: Gerome Sam III M.D   On: 2017-03-30 08:48  Microbiology Recent Results (from the past 240 hour(s))  Blood Culture (routine x 2)     Status: None (Preliminary result)   Collection Time: 03/14/2017  7:51 AM  Result Value Ref Range Status   Specimen Description BLOOD LEFT HAND  Final   Special Requests IN PEDIATRIC BOTTLE Blood Culture adequate volume  Final   Culture NO GROWTH 4 DAYS  Final   Report Status PENDING  Incomplete  Blood Culture (routine x 2)     Status: None (Preliminary result)   Collection Time: 03/20/2017  8:10 AM  Result Value Ref Range Status   Specimen Description BLOOD RIGHT HAND  Final   Special Requests IN PEDIATRIC BOTTLE Blood Culture adequate volume  Final   Culture NO GROWTH 4 DAYS  Final   Report Status PENDING  Incomplete  MRSA PCR Screening     Status: None   Collection Time: 03/19/17  7:00 PM  Result Value Ref Range Status   MRSA by PCR NEGATIVE NEGATIVE Final    Comment:        The GeneXpert MRSA Assay (FDA approved for NASAL specimens only), is one component of a comprehensive MRSA colonization surveillance program. It is not intended to diagnose MRSA infection nor to guide or monitor treatment for MRSA infections.     Lab Basic Metabolic Panel:  Recent Labs Lab 03/30/2017 1921 04/07/2017 2232 03/19/17 0232 03/19/17 0737 03/19/17 1127  NA 142 141 144 141 143  K 2.6* 3.2* 2.9* 3.7 4.3  CL 106 110 110 117* 116*  CO2 17* 18* 21* 16* 16*  GLUCOSE 192* 181* 191* 179* 194*  BUN 23* 16 21* 20 19  CREATININE 1.34* 1.07* 0.93 0.79 0.70  CALCIUM 9.3 9.1 9.3 9.2 9.6   Liver Function Tests:  Recent Labs Lab 03/25/2017 0751 03/19/17 0737  AST 16 15  ALT 12* 11*  ALKPHOS 157* 101  BILITOT 2.5* 0.4  PROT 6.9 4.9*  ALBUMIN 3.3* 2.0*   No results for input(s): LIPASE, AMYLASE in the last 168 hours.  Recent Labs Lab 03/19/2017 0751  AMMONIA 32   CBC:  Recent Labs Lab 03/30/2017 0751 03/16/2017 0804 04/07/2017 1921  03/19/17 0737  WBC 29.7*  --  15.2* 9.8  NEUTROABS 24.6*  --   --   --   HGB 15.3* 16.7* 13.8 12.2  HCT 45.3 49.0* 40.3 34.7*  MCV 84.5  --  80.0 78.9  PLT 330  --  221 158   Cardiac Enzymes:  Recent Labs Lab 04/08/2017 0756  CKTOTAL 25*   Sepsis Labs:  Recent Labs Lab 03/17/2017 0751 04/05/2017 0804 03/15/2017 1143 03/17/2017 1921 03/19/17 0737  WBC 29.7*  --   --  15.2* 9.8  LATICACIDVEN  --  4.84* 1.72  --   --     Procedures/Operations     Ripudeep Rai 04-Apr-2017, 4:50 PM

## 2017-04-10 NOTE — Progress Notes (Signed)
Referral made to [facility] with referral number [phone]. Per Prudence Davidson, they will refer it to their supervisor and will call staff if patient is suitable for any organ donation. Endorsed to the primary nurse.

## 2017-04-10 NOTE — Progress Notes (Signed)
2.5cc IV dilaudid drip wasted with Donetta Potts, RN.

## 2017-04-10 DEATH — deceased

## 2017-09-28 IMAGING — CT CT HEAD W/O CM
3 of 4 series · 16 of 47 positions shown, 19 images · non-contrast
Comparison: None.

CLINICAL DATA: Acute mental status change.  Recent fall.

EXAM:
CT HEAD WITHOUT CONTRAST
TECHNIQUE: Contiguous axial images were obtained from the base of the skull
through the vertex without intravenous contrast.

[Series 201: head w/o, idose (1) · axial · non-contrast · 0.49mm/px · z∈[+733,+893]mm · 10 of 38 slices shown, 13 images]
[im 3/38  brain]
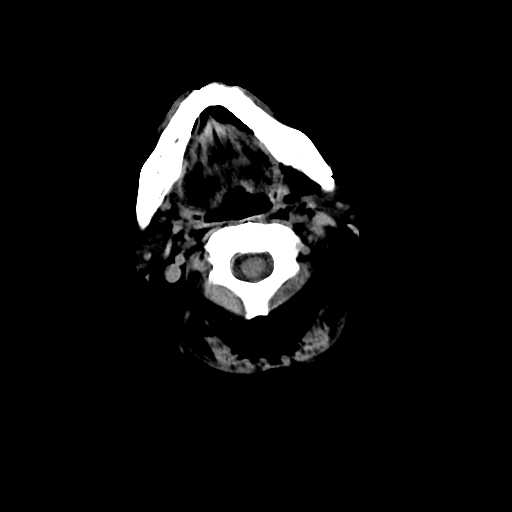
[im 3/38  bone]
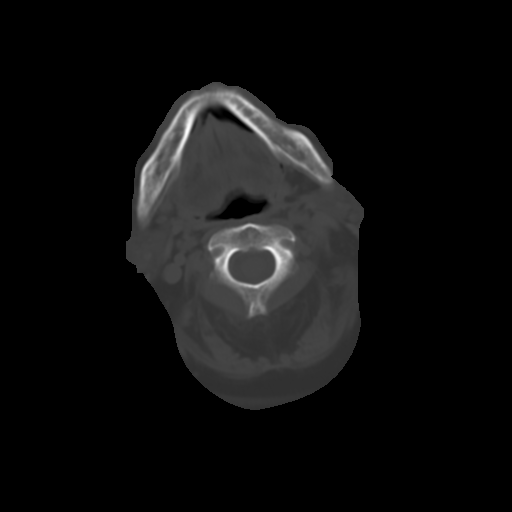
[im 6/38  brain]
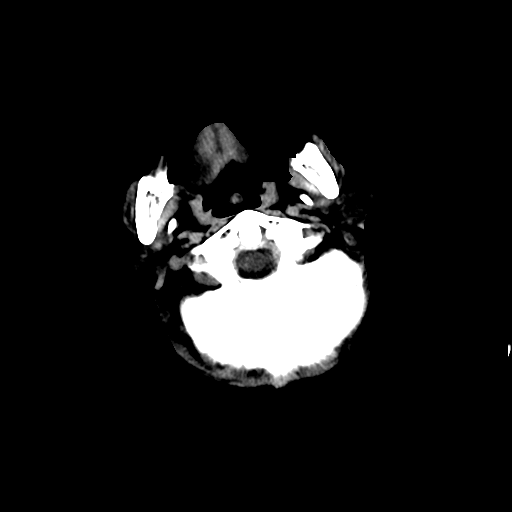
[im 11/38  brain]
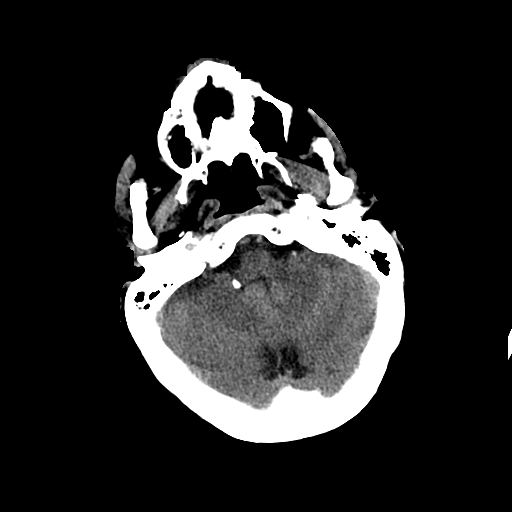
[im 14/38  brain]
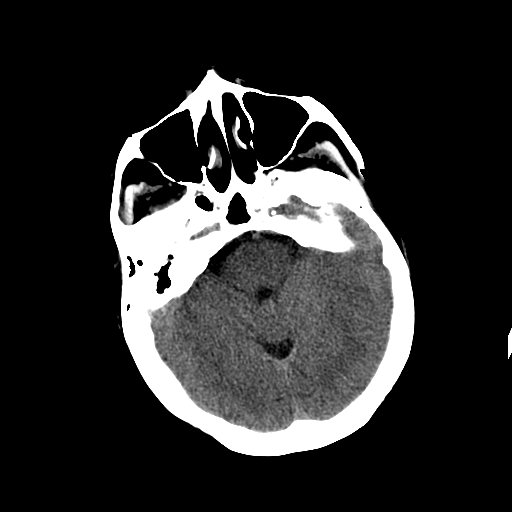
[im 16/38  brain]
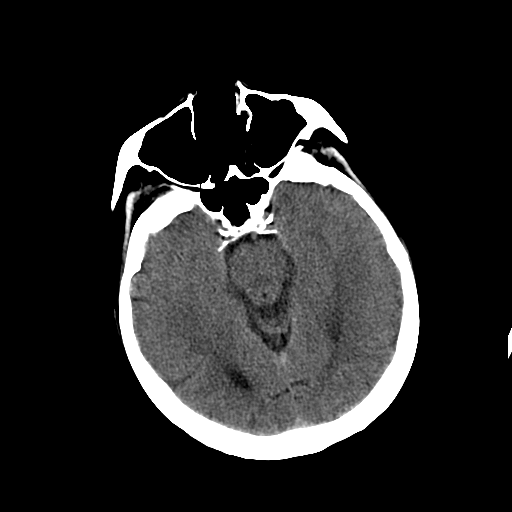
[im 16/38  bone]
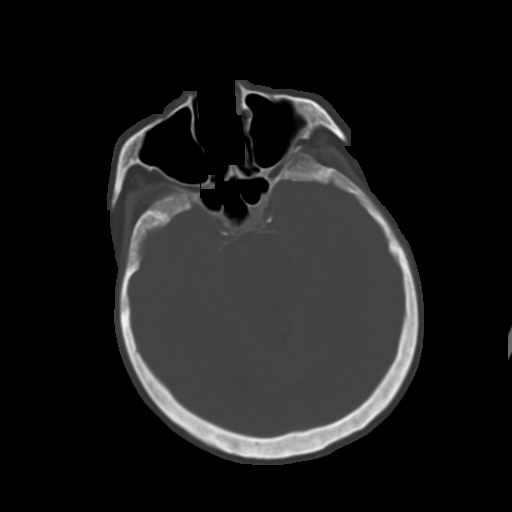
[im 22/38  brain]
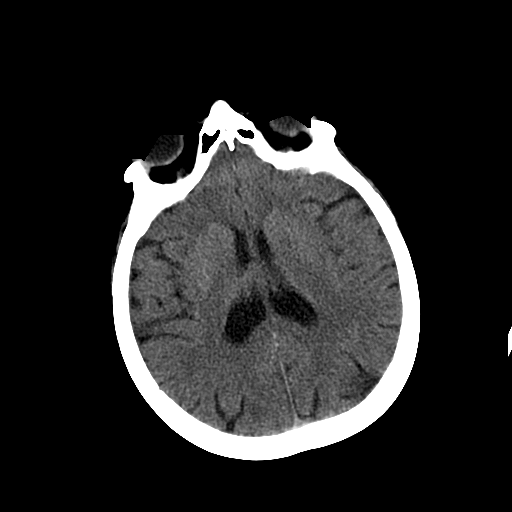
[im 24/38  brain]
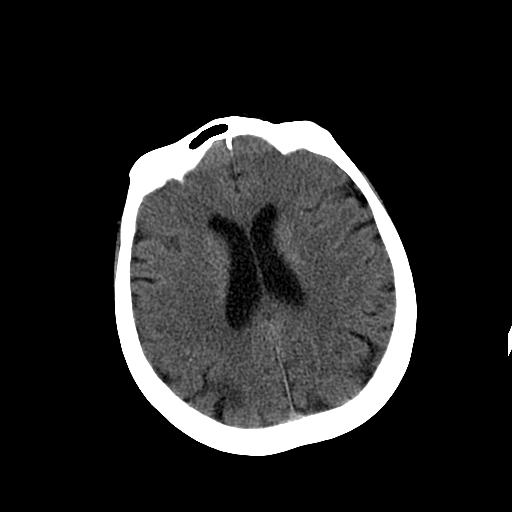
[im 27/38  brain]
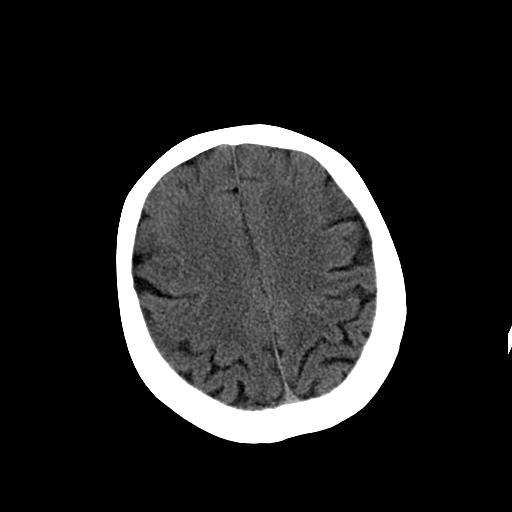
[im 32/38  brain]
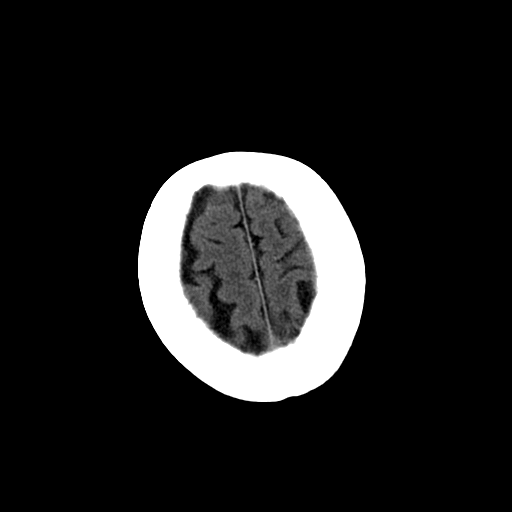
[im 32/38  bone]
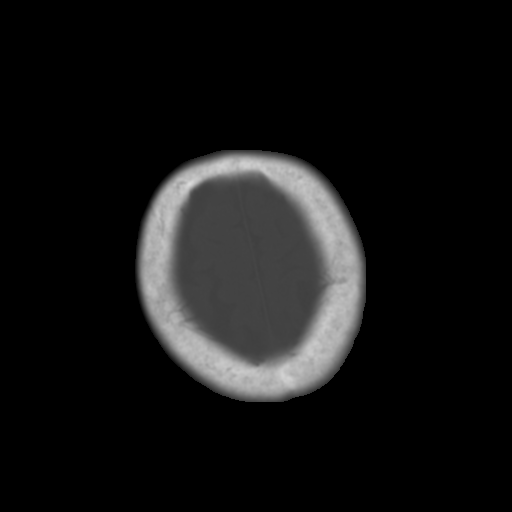
[im 35/38  brain]
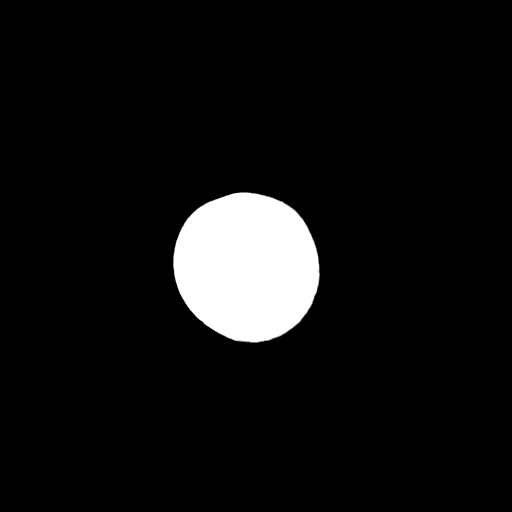

[Series 205: coronal st, idose (1) · coronal · 0.40mm/px · 3 of 68 slices shown]
[im 23/68  brain]
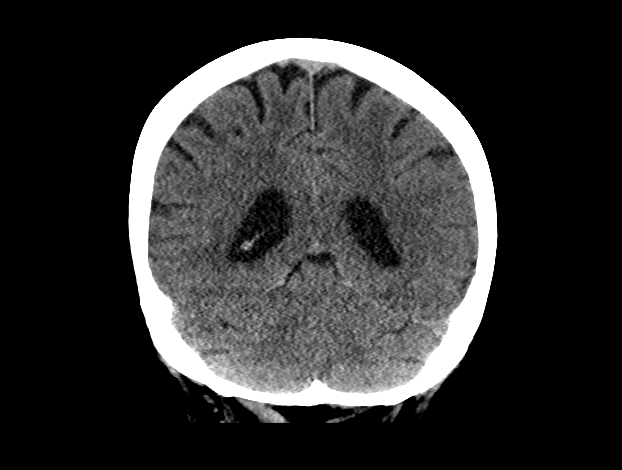
[im 30/68  brain]
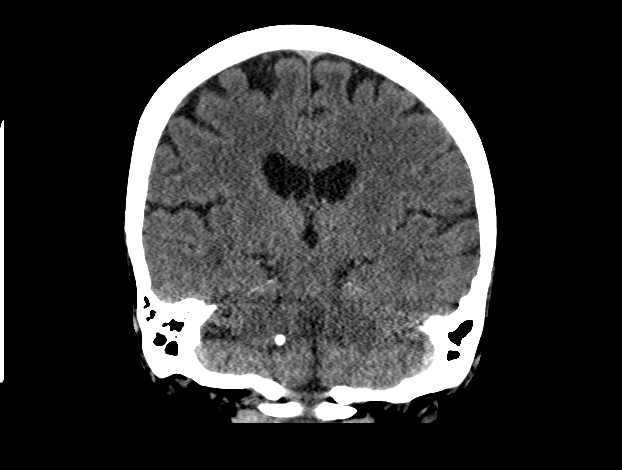
[im 38/68  brain]
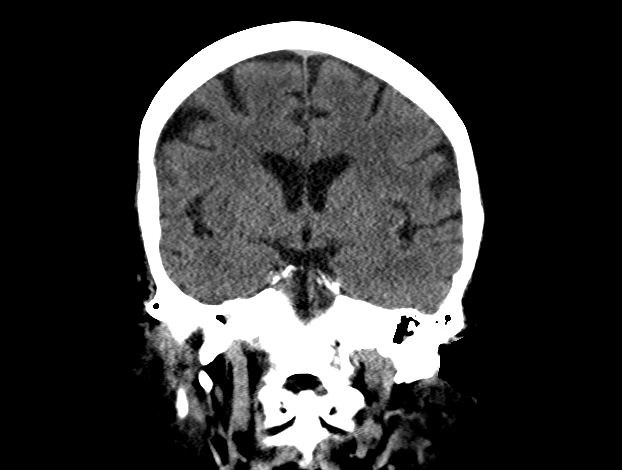

[Series 206: sagittal st, idose (1) · sagittal · 0.40mm/px · 3 of 53 slices shown]
[im 18/53  brain]
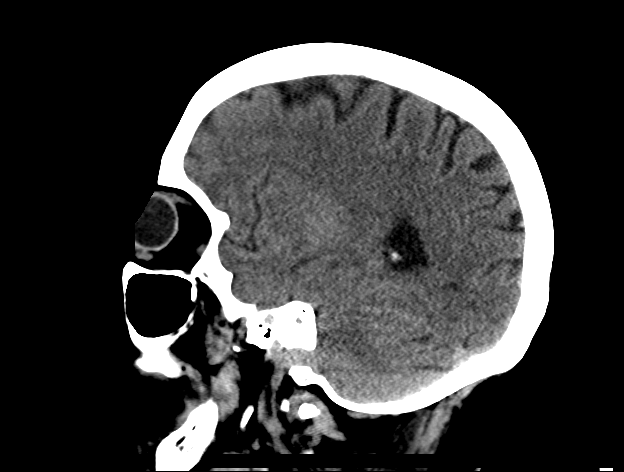
[im 27/53  brain]
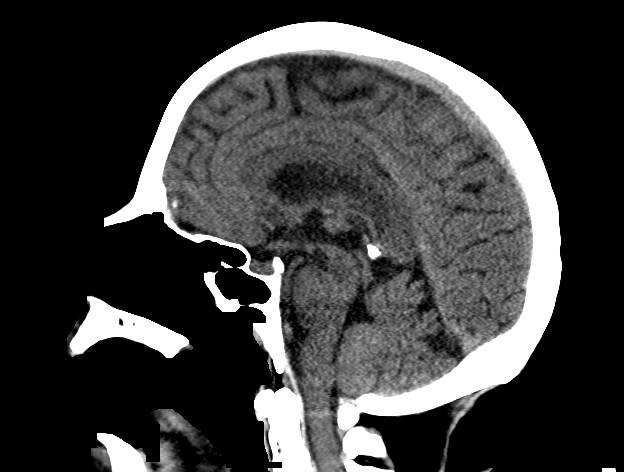
[im 35/53  brain]
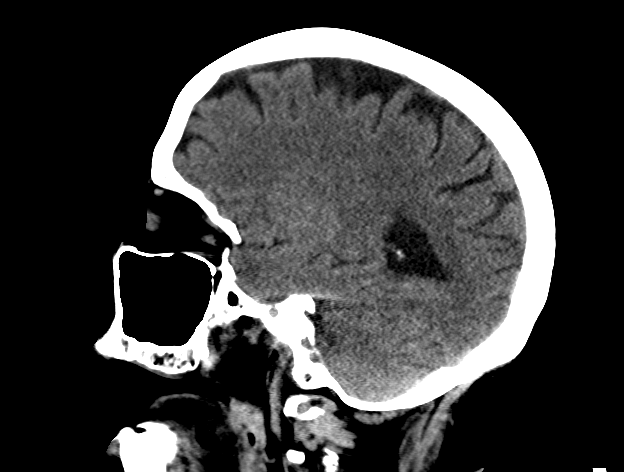

[16 of 47 positions shown; findings below may reference images not displayed]

FINDINGS: Brain: No subdural, epidural, or subarachnoid hemorrhage. Ventricles
and sulci are unremarkable. Cerebellum, brainstem, and basal
cisterns are normal. No acute cortical ischemia or infarct. No mass,
mass effect, or midline shift.

Vascular: Calcified atherosclerosis is seen in the intracranial
carotid arteries.

Skull: Normal. Negative for fracture or focal lesion.

Sinuses/Orbits: The paranasal sinuses are normal. There is fluid in
the inferior mastoid air cells without bony erosion. Middle ears are
well-aerated.

Other: None.
IMPRESSION: 1. No acute intracranial abnormality.
